# Patient Record
Sex: Male | Born: 1970 | Race: Black or African American | Hispanic: No | Smoking: Never smoker
Health system: Southern US, Community
[De-identification: ages and names within clinical notes are randomized; demographics above are authoritative.]

## PROBLEM LIST (undated history)

## (undated) DIAGNOSIS — J189 Pneumonia, unspecified organism: Secondary | ICD-10-CM

## (undated) HISTORY — PX: APPENDECTOMY: SHX54

## (undated) HISTORY — PX: SKIN GRAFT: SHX250

---

## 2007-10-31 ENCOUNTER — Emergency Department (HOSPITAL_COMMUNITY): Admission: EM | Admit: 2007-10-31 | Discharge: 2007-10-31 | Payer: Self-pay | Admitting: Emergency Medicine

## 2009-06-12 ENCOUNTER — Emergency Department (HOSPITAL_COMMUNITY): Admission: EM | Admit: 2009-06-12 | Discharge: 2009-06-12 | Payer: Self-pay | Admitting: Emergency Medicine

## 2009-11-20 ENCOUNTER — Emergency Department (HOSPITAL_COMMUNITY): Admission: EM | Admit: 2009-11-20 | Discharge: 2009-11-20 | Payer: Self-pay | Admitting: Emergency Medicine

## 2009-12-07 ENCOUNTER — Emergency Department (HOSPITAL_COMMUNITY): Admission: EM | Admit: 2009-12-07 | Discharge: 2009-12-08 | Payer: Self-pay | Admitting: Emergency Medicine

## 2010-12-20 IMAGING — CR DG CHEST 2V
2 series · 2 of 2 positions shown · non-contrast
Comparison: None.

CLINICAL DATA: 38-year-old male with flu symptoms, fever, shortness
of breath, cough, congestion, body aches.

CHEST - 2 VIEW

[w chest pa]
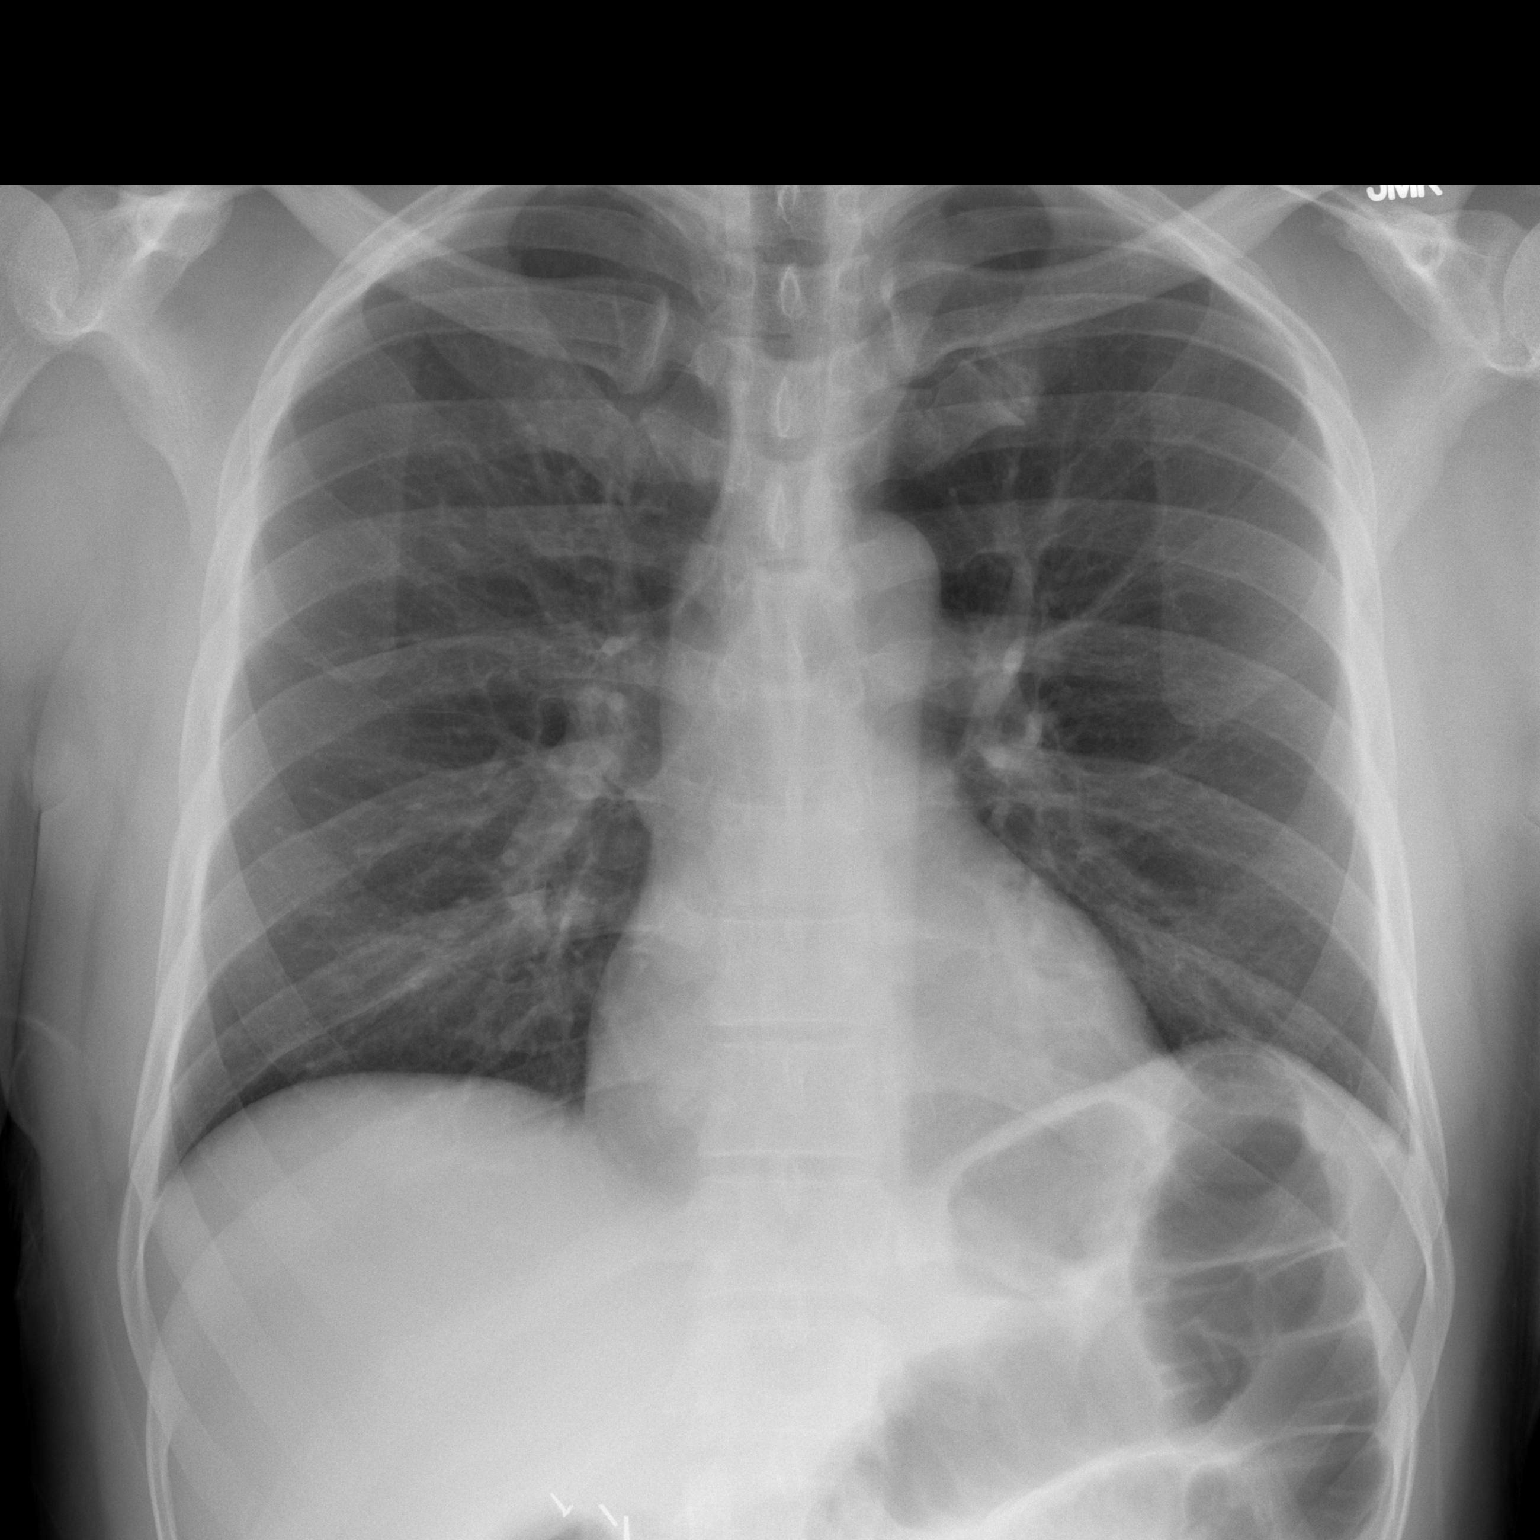

[w chest lat]
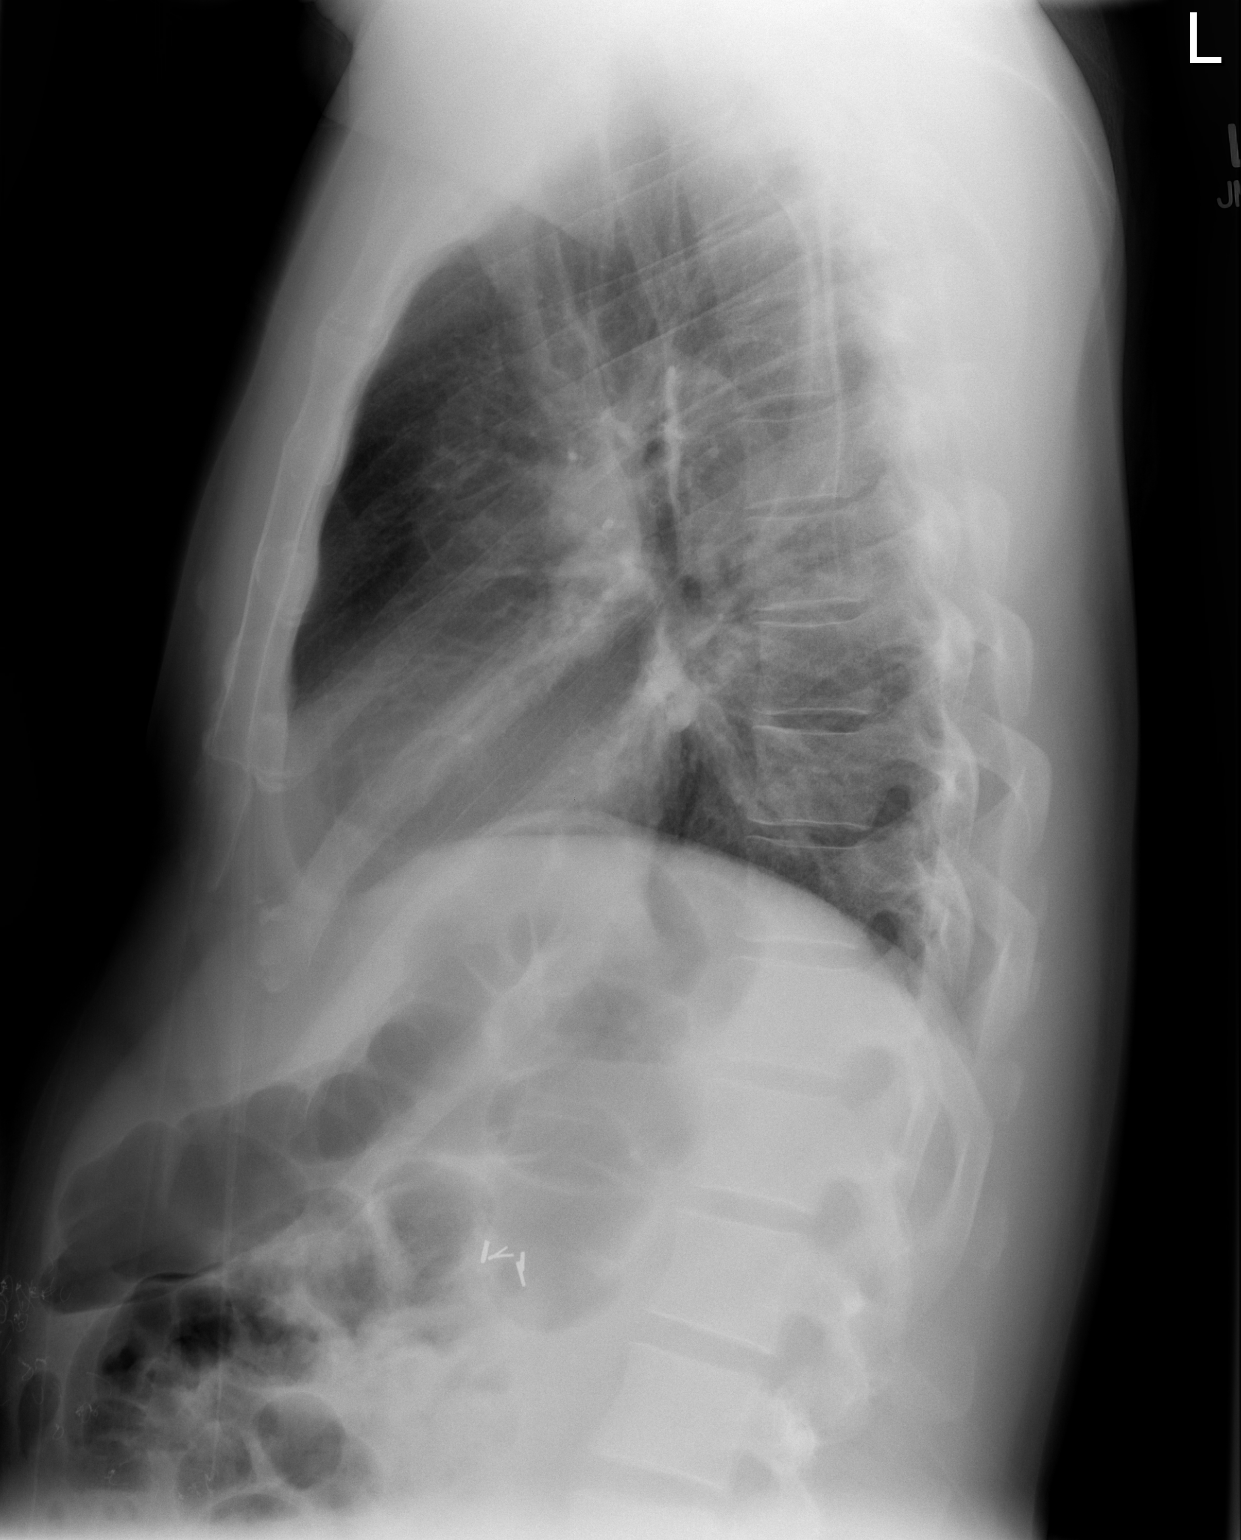

[2 of 2 positions shown; findings below may reference images not displayed]

FINDINGS: Mild elevation of the left hemidiaphragm.  No definite
associated airspace disease or consolidation.  No pleural effusion.
Cardiac size and mediastinal contours are within normal limits.
Visualized tracheal air column is within normal limits.  No acute
osseous abnormality identified.  Surgical clips in the right upper
quadrant.
IMPRESSION: Mild elevation of the left hemidiaphragm without definite
associated airspace disease/pneumonia.

## 2011-02-18 ENCOUNTER — Emergency Department (HOSPITAL_COMMUNITY)
Admission: EM | Admit: 2011-02-18 | Discharge: 2011-02-18 | Disposition: A | Discharge status: Home / Self Care | Payer: Self-pay | Attending: Emergency Medicine | Admitting: Emergency Medicine

## 2011-02-18 DIAGNOSIS — J309 Allergic rhinitis, unspecified: Secondary | ICD-10-CM | POA: Insufficient documentation

## 2011-02-23 LAB — DIFFERENTIAL
Basophils Relative: 0 % (ref 0–1)
Eosinophils Absolute: 0.1 10*3/uL (ref 0.0–0.7)
Lymphs Abs: 0.9 10*3/uL (ref 0.7–4.0)
Neutrophils Relative %: 89 % — ABNORMAL HIGH (ref 43–77)

## 2011-02-23 LAB — URINALYSIS, ROUTINE W REFLEX MICROSCOPIC
Nitrite: NEGATIVE
Specific Gravity, Urine: 1.026 (ref 1.005–1.030)
Urobilinogen, UA: 1 mg/dL (ref 0.0–1.0)

## 2011-02-23 LAB — POCT I-STAT, CHEM 8
Creatinine, Ser: 1.1 mg/dL (ref 0.4–1.5)
HCT: 48 % (ref 39.0–52.0)
Hemoglobin: 16.3 g/dL (ref 13.0–17.0)
Potassium: 3.5 mEq/L (ref 3.5–5.1)
Sodium: 138 mEq/L (ref 135–145)

## 2011-02-23 LAB — URINE MICROSCOPIC-ADD ON

## 2011-02-23 LAB — CBC
MCV: 91.1 fL (ref 78.0–100.0)
Platelets: 124 10*3/uL — ABNORMAL LOW (ref 150–400)
WBC: 13.8 10*3/uL — ABNORMAL HIGH (ref 4.0–10.5)

## 2011-03-21 ENCOUNTER — Emergency Department (HOSPITAL_COMMUNITY)
Admission: EM | Admit: 2011-03-21 | Discharge: 2011-03-21 | Disposition: A | Discharge status: Home / Self Care | Payer: Self-pay | Attending: Emergency Medicine | Admitting: Emergency Medicine

## 2011-03-21 ENCOUNTER — Emergency Department (HOSPITAL_COMMUNITY): Payer: Self-pay

## 2011-03-21 DIAGNOSIS — S63509A Unspecified sprain of unspecified wrist, initial encounter: Secondary | ICD-10-CM | POA: Insufficient documentation

## 2011-03-21 DIAGNOSIS — M25539 Pain in unspecified wrist: Secondary | ICD-10-CM | POA: Insufficient documentation

## 2011-03-21 DIAGNOSIS — X58XXXA Exposure to other specified factors, initial encounter: Secondary | ICD-10-CM | POA: Insufficient documentation

## 2011-03-21 DIAGNOSIS — Y9367 Activity, basketball: Secondary | ICD-10-CM | POA: Insufficient documentation

## 2011-08-31 LAB — DIFFERENTIAL
Basophils Absolute: 0
Basophils Relative: 0
Eosinophils Relative: 1
Lymphocytes Relative: 16
Neutro Abs: 7.8 — ABNORMAL HIGH

## 2011-08-31 LAB — CBC
HCT: 42.9
Platelets: 214
RDW: 12.4

## 2011-08-31 LAB — BASIC METABOLIC PANEL
BUN: 10
Creatinine, Ser: 0.96
GFR calc non Af Amer: >60

## 2012-03-25 ENCOUNTER — Emergency Department (HOSPITAL_COMMUNITY)
Admission: EM | Admit: 2012-03-25 | Discharge: 2012-03-25 | Disposition: A | Discharge status: Home / Self Care | Payer: Self-pay | Attending: Emergency Medicine | Admitting: Emergency Medicine

## 2012-03-25 ENCOUNTER — Encounter (HOSPITAL_COMMUNITY): Payer: Self-pay | Admitting: Emergency Medicine

## 2012-03-25 DIAGNOSIS — Z87828 Personal history of other (healed) physical injury and trauma: Secondary | ICD-10-CM | POA: Insufficient documentation

## 2012-03-25 DIAGNOSIS — L980 Pyogenic granuloma: Secondary | ICD-10-CM | POA: Insufficient documentation

## 2012-03-25 NOTE — ED Provider Notes (Signed)
History     CSN: [id number]  Arrival date & time 03/25/12  4098   First MD Initiated Contact with Patient 03/25/12 1005      Chief Complaint  Patient presents with  . Wound Check    (Consider location/radiation/quality/duration/timing/severity/associated sxs/prior treatment) HPI Comments: Patient reports 3-4 weeks ago he injured his forehead at work but does not remember the circumstances, states that he had a flat scab on his forehead.   States he noticed gradual tissue growth from the wound. Two weeks ago he was seen by his [facility] who put him on antibiotics, the next week they attempted to cauterize it.  Today he presents to the ED for continued nonhealing.  Denies fevers, significant discharge or bleeding.    The history is provided by the patient.    History reviewed. No pertinent past medical history.  History reviewed. No pertinent past surgical history.  History reviewed. No pertinent family history.  History  Substance Use Topics  . Smoking status: Never Smoker   . Smokeless tobacco: Not on file  . Alcohol Use: No      Review of Systems  Constitutional: Negative for fever, chills and fatigue.  Skin: Positive for wound.  Neurological: Negative for weakness and headaches.    Allergies  Review of patient's allergies indicates no known allergies.  Home Medications   Current Outpatient Rx  Name Route Sig Dispense Refill  . ADULT MULTIVITAMIN W/MINERALS CH Oral Take 1 tablet by mouth daily.    Marland Kitchen OVER THE COUNTER MEDICATION Oral Take 1 capsule by mouth 2 (two) times daily. Vitamin Flow Motion      BP 131/88  Pulse 76  Temp(Src) 97.8 F (36.6 C) (Oral)  Resp 18  SpO2 97%  Physical Exam  Nursing note and vitals reviewed. Constitutional: He is oriented to person, place, and time. He appears well-developed and well-nourished. No distress.  HENT:  Head: Macrocephalic.         No surrounding erythema, edema.  No warmth, no discharge or  bleeding.  Nontender to palpation.    Neck: Neck supple.  Pulmonary/Chest: Effort normal.  Neurological: He is alert and oriented to person, place, and time.  Skin: He is not diaphoretic.    ED Course  Procedures (including critical care time)  Labs Reviewed - No data to display No results found.  10:43 AM Patient discussed with and also seen by Dr [facility], who diagnosed pyogenic granuloma and suggests outpatient referral to ENT for excision.    I spoke with Dr [facility] who agrees to see patient in his clinic in [place].  I have discussed the diagnosis and follow up plan with patient who agrees and verbalizes understanding.    1. Pyogenic granuloma of skin       MDM  Patient with gradual onset of skin lesion following trauma (although unclear how the trauma occurred).  Has formed pyogenic granuloma.  Pt to f/u as outpatient with Dr [facility] (ENT) for further evaluation and treatment.  Patient verbalizes understanding and agrees with plan.          Thomas Holt, [place] 03/25/12 1233

## 2012-03-25 NOTE — Discharge Instructions (Signed)
Please call the doctor listed above to schedule an appointment to have your pyogenic granuloma removed.  His clinic is in [place].  Please tell his office staff that we spoke to him from the ER, and he is expecting to see you in clinic.   You may return to the ER at any time for worsening condition or any new symptoms that concern you

## 2012-03-25 NOTE — ED Notes (Signed)
Pt has small wound on forehead that he sts has not healed in 3 weeks

## 2012-03-25 NOTE — ED Notes (Signed)
Pt. Has a wound on his forehead that was a pimple and he squeezed it and now has this solid tissue coming from it and it is bleeding.  Pt. Went to his doctor, but he attempted to cauderize it, but pt. Reports that it was unsuccessful.  He is on antibiotics

## 2012-03-28 NOTE — ED Provider Notes (Signed)
Medical screening examination/treatment/procedure(s) were conducted as a shared visit with non-physician practitioner(s) and myself.  I personally evaluated the patient during the encounter Pt with a granulomatous lesion appx 1 cm on forehead, appears to be a pyogenic granuloma.  Referral to ENT for treatment of this lesion.   Carleene Cooper III, MD 03/28/12 1145

## 2012-09-27 ENCOUNTER — Emergency Department (HOSPITAL_COMMUNITY)
Admission: EM | Admit: 2012-09-27 | Discharge: 2012-09-27 | Disposition: A | Discharge status: Home / Self Care | Payer: Self-pay | Attending: Emergency Medicine | Admitting: Emergency Medicine

## 2012-09-27 DIAGNOSIS — J069 Acute upper respiratory infection, unspecified: Secondary | ICD-10-CM | POA: Insufficient documentation

## 2012-09-27 DIAGNOSIS — J3489 Other specified disorders of nose and nasal sinuses: Secondary | ICD-10-CM | POA: Insufficient documentation

## 2012-09-27 DIAGNOSIS — J029 Acute pharyngitis, unspecified: Secondary | ICD-10-CM | POA: Insufficient documentation

## 2012-09-27 DIAGNOSIS — B9789 Other viral agents as the cause of diseases classified elsewhere: Secondary | ICD-10-CM

## 2012-09-27 DIAGNOSIS — IMO0001 Reserved for inherently not codable concepts without codable children: Secondary | ICD-10-CM | POA: Insufficient documentation

## 2012-09-27 DIAGNOSIS — R509 Fever, unspecified: Secondary | ICD-10-CM | POA: Insufficient documentation

## 2012-09-27 MED ORDER — GUAIFENESIN 100 MG/5ML PO LIQD: 100.0000 mg | ORAL | Status: DC | PRN

## 2012-09-27 MED ORDER — IBUPROFEN 800 MG PO TABS: 800.0000 mg | ORAL_TABLET | Freq: Three times a day (TID) | ORAL | Status: DC | PRN

## 2012-09-27 NOTE — ED Provider Notes (Signed)
Medical screening examination/treatment/procedure(s) were performed by non-physician practitioner and as supervising physician I was immediately available for consultation/collaboration.  Jaston Havens, MD 09/27/12 2312 

## 2012-09-27 NOTE — ED Notes (Signed)
Pt states he began having flu like sx yesterday.  Left school today because he felt bad.  C/o cough, sore throat, runny nose, fatigue, and body aches.

## 2012-09-27 NOTE — ED Provider Notes (Signed)
History     CSN: [id number]  Arrival date & time 09/27/12  1657   First MD Initiated Contact with Patient 09/27/12 1829      Chief Complaint  Patient presents with  . Cough  . Sore Throat  . Nasal Congestion    (Consider location/radiation/quality/duration/timing/severity/associated sxs/prior treatment) HPI Comments: Patient reports he has had sore throat, nasal congestion, dry cough, body aches, subjective fever since yesterday.  Denies SOB, difficulty swallowing.  Pt has had sick contacts with similar symptoms.  Has taken nothing for his symptoms.  Came to ED today to "make sure I was okay."    Patient is a 41 y.o. male presenting with cough and pharyngitis. The history is provided by the patient.  Cough Associated symptoms include rhinorrhea, sore throat and myalgias. Pertinent negatives include no chest pain, no ear pain and no shortness of breath.  Sore Throat Associated symptoms include congestion, coughing, a fever, myalgias and a sore throat. Pertinent negatives include no abdominal pain, chest pain, nausea or vomiting.    No past medical history on file.  No past surgical history on file.  No family history on file.  History  Substance Use Topics  . Smoking status: Never Smoker   . Smokeless tobacco: Not on file  . Alcohol Use: No      Review of Systems  Constitutional: Positive for fever.  HENT: Positive for congestion, sore throat and rhinorrhea. Negative for ear pain and trouble swallowing.   Respiratory: Positive for cough. Negative for shortness of breath.   Cardiovascular: Negative for chest pain.  Gastrointestinal: Negative for nausea, vomiting, abdominal pain and diarrhea.  Musculoskeletal: Positive for myalgias.    Allergies  Review of patient's allergies indicates no known allergies.  Home Medications   Current Outpatient Rx  Name  Route  Sig  Dispense  Refill  . ADULT MULTIVITAMIN W/MINERALS CH   Oral   Take 1 tablet by mouth daily.         Marland Kitchen OVER THE COUNTER MEDICATION   Oral   Take 1 capsule by mouth 2 (two) times daily. Vitamin Flow Motion           BP 135/87  Pulse 99  Temp 98.4 F (36.9 C) (Oral)  Resp 18  SpO2 98%  Physical Exam  Nursing note and vitals reviewed. Constitutional: He appears well-developed and well-nourished. No distress.  HENT:  Head: Normocephalic and atraumatic.  Nose: Mucosal edema and rhinorrhea present. Right sinus exhibits no maxillary sinus tenderness and no frontal sinus tenderness. Left sinus exhibits no maxillary sinus tenderness and no frontal sinus tenderness.  Mouth/Throat: Oropharynx is clear and moist. No oropharyngeal exudate.  Neck: Trachea normal, normal range of motion and phonation normal. Neck supple. No tracheal tenderness present. No tracheal deviation present.  Cardiovascular: Normal rate and regular rhythm.   Pulmonary/Chest: Effort normal and breath sounds normal. No stridor. No respiratory distress. He has no wheezes. He has no rales.  Lymphadenopathy:    He has no cervical adenopathy.  Neurological: He is alert.  Skin: He is not diaphoretic.    ED Course  Procedures (including critical care time)  Labs Reviewed - No data to display No results found.   1. Viral respiratory infection     MDM  Pt with two days of respiratory symptoms, recent sick contacts.  Lungs CTAB, doubt pneumonia.  Oropharynx clear without edema, exudates, no cervical lymphadenopathy, cough is present, pt is afebrile - doubt strep.  Like viral infection.  Pt  d/c home with symptomatic treatment.  Discussed diagnosis and plan with patient.  Pt verbalizes understanding and agrees with plan.   Pt given return precautions.          [place], [place] 09/27/12 [phone]

## 2013-05-17 ENCOUNTER — Emergency Department (HOSPITAL_COMMUNITY): Payer: Self-pay

## 2013-05-17 ENCOUNTER — Encounter (HOSPITAL_COMMUNITY): Payer: Self-pay

## 2013-05-17 ENCOUNTER — Emergency Department (HOSPITAL_COMMUNITY)
Admission: EM | Admit: 2013-05-17 | Discharge: 2013-05-17 | Disposition: A | Discharge status: Home / Self Care | Payer: Self-pay | Attending: Emergency Medicine | Admitting: Emergency Medicine

## 2013-05-17 DIAGNOSIS — R0789 Other chest pain: Secondary | ICD-10-CM | POA: Insufficient documentation

## 2013-05-17 LAB — POCT I-STAT TROPONIN I: Troponin i, poc: 0 ng/mL (ref 0.00–0.08)

## 2013-05-17 LAB — BASIC METABOLIC PANEL
BUN: 13 mg/dL (ref 6–23)
Chloride: 105 mEq/L (ref 96–112)
GFR calc Af Amer: 86 mL/min — ABNORMAL LOW (ref >90)
Potassium: 3.4 mEq/L — ABNORMAL LOW (ref 3.5–5.1)
Sodium: 142 mEq/L (ref 135–145)

## 2013-05-17 LAB — CBC
HCT: 44.4 % (ref 39.0–52.0)
RDW: 12.5 % (ref 11.5–15.5)
WBC: 9.7 10*3/uL (ref 4.0–10.5)

## 2013-05-17 MED ORDER — ASPIRIN 81 MG PO CHEW: 324.0000 mg | CHEWABLE_TABLET | Freq: Once | ORAL | Status: DC

## 2013-05-17 NOTE — ED Notes (Signed)
Patient ambulated without difficulty. O2 sats remained at 95% while ambulating.

## 2013-05-17 NOTE — ED Provider Notes (Signed)
History    CSN: [id number] Arrival date & time 05/17/13  1221  First MD Initiated Contact with Patient 05/17/13 1249     Chief Complaint  Patient presents with  . Chest Pain   (Consider location/radiation/quality/duration/timing/severity/associated sxs/prior Treatment) HPI 42 yo male presents to the ER via EMS from local basketball gym with complaint of chest pain.  Pt reports during the game he had sudden onset of right sided chest pain.  He reports the pain was sharp, shooting, severe.  He was unable to stand up with the pain.  After resting a few minutes, the pain eased.  It has since resolved.  No prior h/o same.  He denies diaphoresis or nausea with the pain.  No radiation of the pain.  He was slightly sob with the pain.  No current symptoms.  Pt denies pmh.  Family history of htn, dm, cad in father in 48's.    History reviewed. No pertinent past medical history. History reviewed. No pertinent past surgical history. History reviewed. No pertinent family history. History  Substance Use Topics  . Smoking status: Never Smoker   . Smokeless tobacco: Not on file  . Alcohol Use: No    Review of Systems  All other systems reviewed and are negative.    Allergies  Review of patient's allergies indicates no known allergies.  Home Medications  No current outpatient prescriptions on file. BP 128/69  Pulse 102  Temp(Src) 98 F (36.7 C) (Oral)  Resp 20  SpO2 95% Physical Exam  Nursing note and vitals reviewed. Constitutional: He is oriented to person, place, and time. He appears well-developed and well-nourished.  HENT:  Head: Normocephalic and atraumatic.  Right Ear: External ear normal.  Left Ear: External ear normal.  Nose: Nose normal.  Mouth/Throat: Oropharynx is clear and moist.  Eyes: Conjunctivae and EOM are normal. Pupils are equal, round, and reactive to light.  Neck: Normal range of motion. Neck supple. No JVD present. No tracheal deviation present. No  thyromegaly present.  Cardiovascular: Normal rate, regular rhythm, normal heart sounds and intact distal pulses.  Exam reveals no gallop and no friction rub.   No murmur heard. Pulmonary/Chest: Effort normal and breath sounds normal. No stridor. No respiratory distress. He has no wheezes. He has no rales. He exhibits no tenderness.  Abdominal: Soft. Bowel sounds are normal. He exhibits no distension and no mass. There is no tenderness. There is no rebound and no guarding.  Musculoskeletal: Normal range of motion. He exhibits no edema and no tenderness.  Lymphadenopathy:    He has no cervical adenopathy.  Neurological: He is oriented to person, place, and time. He has normal reflexes. No cranial nerve deficit. He exhibits normal muscle tone. Coordination normal.  Skin: Skin is dry. No rash noted. No erythema. No pallor.  Psychiatric: He has a normal mood and affect. His behavior is normal. Judgment and thought content normal.    ED Course  Procedures (including critical care time) Labs Reviewed  BASIC METABOLIC PANEL - Abnormal; Notable for the following:    Potassium 3.4 (*)    Glucose, Bld 105 (*)    GFR calc non Af Amer 75 (*)    GFR calc Af Amer 86 (*)    All other components within normal limits  CBC  POCT I-STAT TROPONIN I    Date: 05/17/2013  Rate: 104  Rhythm: sinus tachycardia  QRS Axis: normal  Intervals: normal  ST/T Wave abnormalities: nonspecific T wave changes  Conduction Disutrbances:none  Narrative Interpretation:   Old EKG Reviewed: none available   Dg Chest 2 View  05/17/2013   *RADIOLOGY REPORT*  Clinical Data: Chest pain after exercise.  CHEST - 2 VIEW  Comparison: 11/20/2009.  Findings: There are lower lung volumes with new bibasilar atelectasis.  No consolidation, edema or pleural effusion is present.  Heart size and mediastinal contours are stable.  The osseous structures appear normal.  Cholecystectomy clips are noted.  IMPRESSION: Mild bibasilar  atelectasis.  No other acute findings.   Original Report Authenticated By: Carey Bullocks, M.D.   1. Chest pain, atypical     MDM  42 yo male with sharp right sided chest pain during basketball game today.  Sxs have since resolved.  Will check cxr, labs.  Seems very atypical for ACS.  PTX in differential, but now asymptomatic.    Olivia Mackie, MD 05/18/13 [phone]

## 2013-05-17 NOTE — ED Notes (Signed)
Pt complains of sharp right sided chest pain while playing basketball, subside with rest.

## 2014-01-25 ENCOUNTER — Encounter (HOSPITAL_COMMUNITY): Payer: Self-pay | Admitting: Emergency Medicine

## 2014-01-25 ENCOUNTER — Emergency Department (HOSPITAL_COMMUNITY)
Admission: EM | Admit: 2014-01-25 | Discharge: 2014-01-25 | Disposition: A | Discharge status: Home / Self Care | Payer: No Typology Code available for payment source | Source: Home / Self Care | Attending: Emergency Medicine | Admitting: Emergency Medicine

## 2014-01-25 DIAGNOSIS — M545 Low back pain, unspecified: Secondary | ICD-10-CM

## 2014-01-25 MED ORDER — IBUPROFEN 800 MG PO TABS
ORAL_TABLET | ORAL | Status: AC
  Filled 2014-01-25: qty 1

## 2014-01-25 MED ORDER — IBUPROFEN 600 MG PO TABS: 600.0000 mg | ORAL_TABLET | Freq: Three times a day (TID) | ORAL | Status: DC | PRN

## 2014-01-25 MED ORDER — IBUPROFEN 800 MG PO TABS
800.0000 mg | ORAL_TABLET | Freq: Once | ORAL | Status: AC
  Administered 2014-01-25: 800 mg via ORAL

## 2014-01-25 NOTE — Discharge Instructions (Signed)
Back Injury Prevention Back injuries can be extremely painful and difficult to heal. After having one back injury, you are much more likely to experience another later on. It is important to learn how to avoid injuring or re-injuring your back. The following tips can help you to prevent a back injury. PHYSICAL FITNESS  Exercise regularly and try to develop good tone in your abdominal muscles. Your abdominal muscles provide a lot of the support needed by your back.  Do aerobic exercises (walking, jogging, biking, swimming) regularly.  Do exercises that increase balance and strength (tai chi, yoga) regularly. This can decrease your risk of falling and injuring your back.  Stretch before and after exercising.  Maintain a healthy weight. The more you weigh, the more stress is placed on your back. For every pound of weight, 10 times that amount of pressure is placed on the back. DIET  Talk to your caregiver about how much calcium and vitamin D you need per day. These nutrients help to prevent weakening of the bones (osteoporosis). Osteoporosis can cause broken (fractured) bones that lead to back pain.  Include good sources of calcium in your diet, such as dairy products, green, leafy vegetables, and products with calcium added (fortified).  Include good sources of vitamin D in your diet, such as milk and foods that are fortified with vitamin D.  Consider taking a nutritional supplement or a multivitamin if needed.  Stop smoking if you smoke. POSTURE  Sit and stand up straight. Avoid leaning forward when you sit or hunching over when you stand.  Choose chairs with good low back (lumbar) support.  If you work at a desk, sit close to your work so you do not need to lean over. Keep your chin tucked in. Keep your neck drawn back and elbows bent at a right angle. Your arms should look like the letter "L."  Sit high and close to the steering wheel when you drive. Add a lumbar support to your car  seat if needed.  Avoid sitting or standing in one position for too long. Take breaks to get up, stretch, and walk around at least once every hour. Take breaks if you are driving for long periods of time.  Sleep on your side with your knees slightly bent, or sleep on your back with a pillow under your knees. Do not sleep on your stomach. LIFTING, TWISTING, AND REACHING  Avoid heavy lifting, especially repetitive lifting. If you must do heavy lifting:  Stretch before lifting.  Work slowly.  Rest between lifts.  Use carts and dollies to move objects when possible.  Make several small trips instead of carrying 1 heavy load.  Ask for help when you need it.  Ask for help when moving big, awkward objects.  Follow these steps when lifting:  Stand with your feet shoulder-width apart.  Get as close to the object as you can. Do not try to pick up heavy objects that are far from your body.  Use handles or lifting straps if they are available.  Bend at your knees. Squat down, but keep your heels off the floor.  Keep your shoulders pulled back, your chin tucked in, and your back straight.  Lift the object slowly, tightening the muscles in your legs, abdomen, and buttocks. Keep the object as close to the center of your body as possible.  When you put a load down, use these same guidelines in reverse.  Do not:  Lift the object above your waist.  Twist at the waist while lifting or carrying a load. Move your feet if you need to turn, not your waist. °· Bend over without bending at your knees. °· Avoid reaching over your head, across a table, or for an object on a high surface. °OTHER TIPS °· Avoid wet floors and keep sidewalks clear of ice to prevent falls. °· Do not sleep on a mattress that is too soft or too hard. °· Keep items that are used frequently within easy reach. °· Put heavier objects on shelves at waist level and lighter objects on lower or higher shelves. °· Find ways to  decrease your stress, such as exercise, massage, or relaxation techniques. Stress can build up in your muscles. Tense muscles are more vulnerable to injury. °· Seek treatment for depression or anxiety if needed. These conditions can increase your risk of developing back pain. °SEEK MEDICAL CARE IF: °· You injure your back. °· You have questions about diet, exercise, or other ways to prevent back injuries. °MAKE SURE YOU: °· Understand these instructions. °· Will watch your condition. °· Will get help right away if you are not doing well or get worse. °Document Released: 12/17/2004 Document Revised: 02/01/2012 Document Reviewed: 12/21/2011 °[employer]® Patient Information ©2014 [employer]. ° °Back Exercises °Back exercises help treat and prevent back injuries. The goal of back exercises is to increase the strength of your abdominal and back muscles and the flexibility of your back. These exercises should be started when you no longer have back pain. Back exercises include: °· Pelvic Tilt. Lie on your back with your knees bent. Tilt your pelvis until the lower part of your back is against the floor. Hold this position 5 to 10 sec and repeat 5 to 10 times. °· Knee to Chest. Pull first 1 knee up against your chest and hold for 20 to 30 seconds, repeat this with the other knee, and then both knees. This may be done with the other leg straight or bent, whichever feels better. °· Sit-Ups or Curl-Ups. Bend your knees 90 degrees. Start with tilting your pelvis, and do a partial, slow sit-up, lifting your trunk only 30 to 45 degrees off the floor. Take at least 2 to 3 seconds for each sit-up. Do not do sit-ups with your knees out straight. If partial sit-ups are difficult, simply do the above but with only tightening your abdominal muscles and holding it as directed. °· Hip-Lift. Lie on your back with your knees flexed 90 degrees. Push down with your feet and shoulders as you raise your hips a couple inches off the floor;  hold for 10 seconds, repeat 5 to 10 times. °· Back arches. Lie on your stomach, propping yourself up on bent elbows. Slowly press on your hands, causing an arch in your low back. Repeat 3 to 5 times. Any initial stiffness and discomfort should lessen with repetition over time. °· Shoulder-Lifts. Lie face down with arms beside your body. Keep hips and torso pressed to floor as you slowly lift your head and shoulders off the floor. °Do not overdo your exercises, especially in the beginning. Exercises may cause you some mild back discomfort which lasts for a few minutes; however, if the pain is more severe, or lasts for more than 15 minutes, do not continue exercises until you see your caregiver. Improvement with exercise therapy for back problems is slow.  °See your caregivers for assistance with developing a proper back exercise program. °Document Released: 12/17/2004 Document Revised: 02/01/2012 Document Reviewed:   09/10/2011 [employer] Patient Information 2014 [employer].

## 2014-01-25 NOTE — ED Provider Notes (Signed)
Medical screening examination/treatment/procedure(s) were performed by non-physician practitioner and as supervising physician I was immediately available for consultation/collaboration.  Myleka Moncure, M.D.  Mya Suell C Marton Malizia, MD 01/25/14 2213 

## 2014-01-25 NOTE — ED Notes (Signed)
C/o lower back pain after playing basketball yesterday.  Denies any known injury and urinary symptoms.  No otc meds taken

## 2014-01-25 NOTE — ED Provider Notes (Signed)
CSN: [id number]     Arrival date & time 01/25/14  1854 History   First MD Initiated Contact with Patient 01/25/14 2007     Chief Complaint  Patient presents with  . Back Pain   (Consider location/radiation/quality/duration/timing/severity/associated sxs/prior Treatment) Patient is a 43 y.o. male presenting with back pain. The history is provided by the patient.  Back Pain Pain location: +lumbar region. Quality:  Aching and stiffness Radiates to:  Does not radiate Pain severity:  Mild Duration:  1 day Progression:  Waxing and waning Chronicity:  Recurrent Context comment:  Began a few hours after playing a basketball game Relieved by:  None tried Worsened by:  Nothing tried Ineffective treatments:  None tried Associated symptoms: no abdominal pain, no abdominal swelling, no bladder incontinence, no bowel incontinence, no chest pain, no dysuria, no fever, no headaches, no leg pain, no numbness, no paresthesias, no pelvic pain, no perianal numbness, no tingling, no weakness and no weight loss     History reviewed. No pertinent past medical history. History reviewed. No pertinent past surgical history. History reviewed. No pertinent family history. History  Substance Use Topics  . Smoking status: Never Smoker   . Smokeless tobacco: Not on file  . Alcohol Use: No    Review of Systems  Constitutional: Negative for fever and weight loss.  Cardiovascular: Negative for chest pain.  Gastrointestinal: Negative for abdominal pain and bowel incontinence.  Genitourinary: Negative for bladder incontinence, dysuria and pelvic pain.  Musculoskeletal: Positive for back pain.  Neurological: Negative for tingling, weakness, numbness, headaches and paresthesias.  All other systems reviewed and are negative.    Allergies  Review of patient's allergies indicates no known allergies.  Home Medications   Current Outpatient Rx  Name  Route  Sig  Dispense  Refill  . ibuprofen (ADVIL,MOTRIN)  600 MG tablet   Oral   Take 1 tablet (600 mg total) by mouth every 8 (eight) hours as needed for mild pain or moderate pain.   30 tablet   0    BP 129/84  Pulse 93  Temp(Src) 98.7 F (37.1 C) (Oral)  Resp 20  SpO2 99% Physical Exam  Nursing note and vitals reviewed. Constitutional: He is oriented to person, place, and time. He appears well-developed and well-nourished. No distress.  HENT:  Head: Normocephalic and atraumatic.  Eyes: Conjunctivae are normal. No scleral icterus.  Neck: Normal range of motion. Neck supple.  Cardiovascular: Normal rate, regular rhythm and normal heart sounds.   Pulmonary/Chest: Effort normal and breath sounds normal.  Abdominal: Soft. Bowel sounds are normal. He exhibits no distension. There is no tenderness.  Musculoskeletal: Normal range of motion.       Lumbar back: He exhibits normal range of motion, no tenderness, no bony tenderness, no swelling, no edema, no deformity, no laceration, no pain, no spasm and normal pulse.       Back:  Neurological: He is alert and oriented to person, place, and time. He has normal strength. No sensory deficit. Coordination and gait normal.  Reflex Scores:      Patellar reflexes are 2+ on the right side and 2+ on the left side.   ED Course  Procedures (including critical care time) Labs Review Labs Reviewed - No data to display Imaging Review No results found.   MDM   1. Lumbago    Mild lumbago after sports participation. Exam without worrisome finding. NSAIDs at home and follow up prn.    Ardis RowanJennifer Lee Reda Gettis, PA 01/25/14  2025 

## 2014-06-16 IMAGING — CR DG CHEST 2V
2 series · 2 of 2 positions shown · non-contrast
Comparison: 11/20/2009.

CLINICAL DATA: Chest pain after exercise.

CHEST - 2 VIEW

[w chest pa]
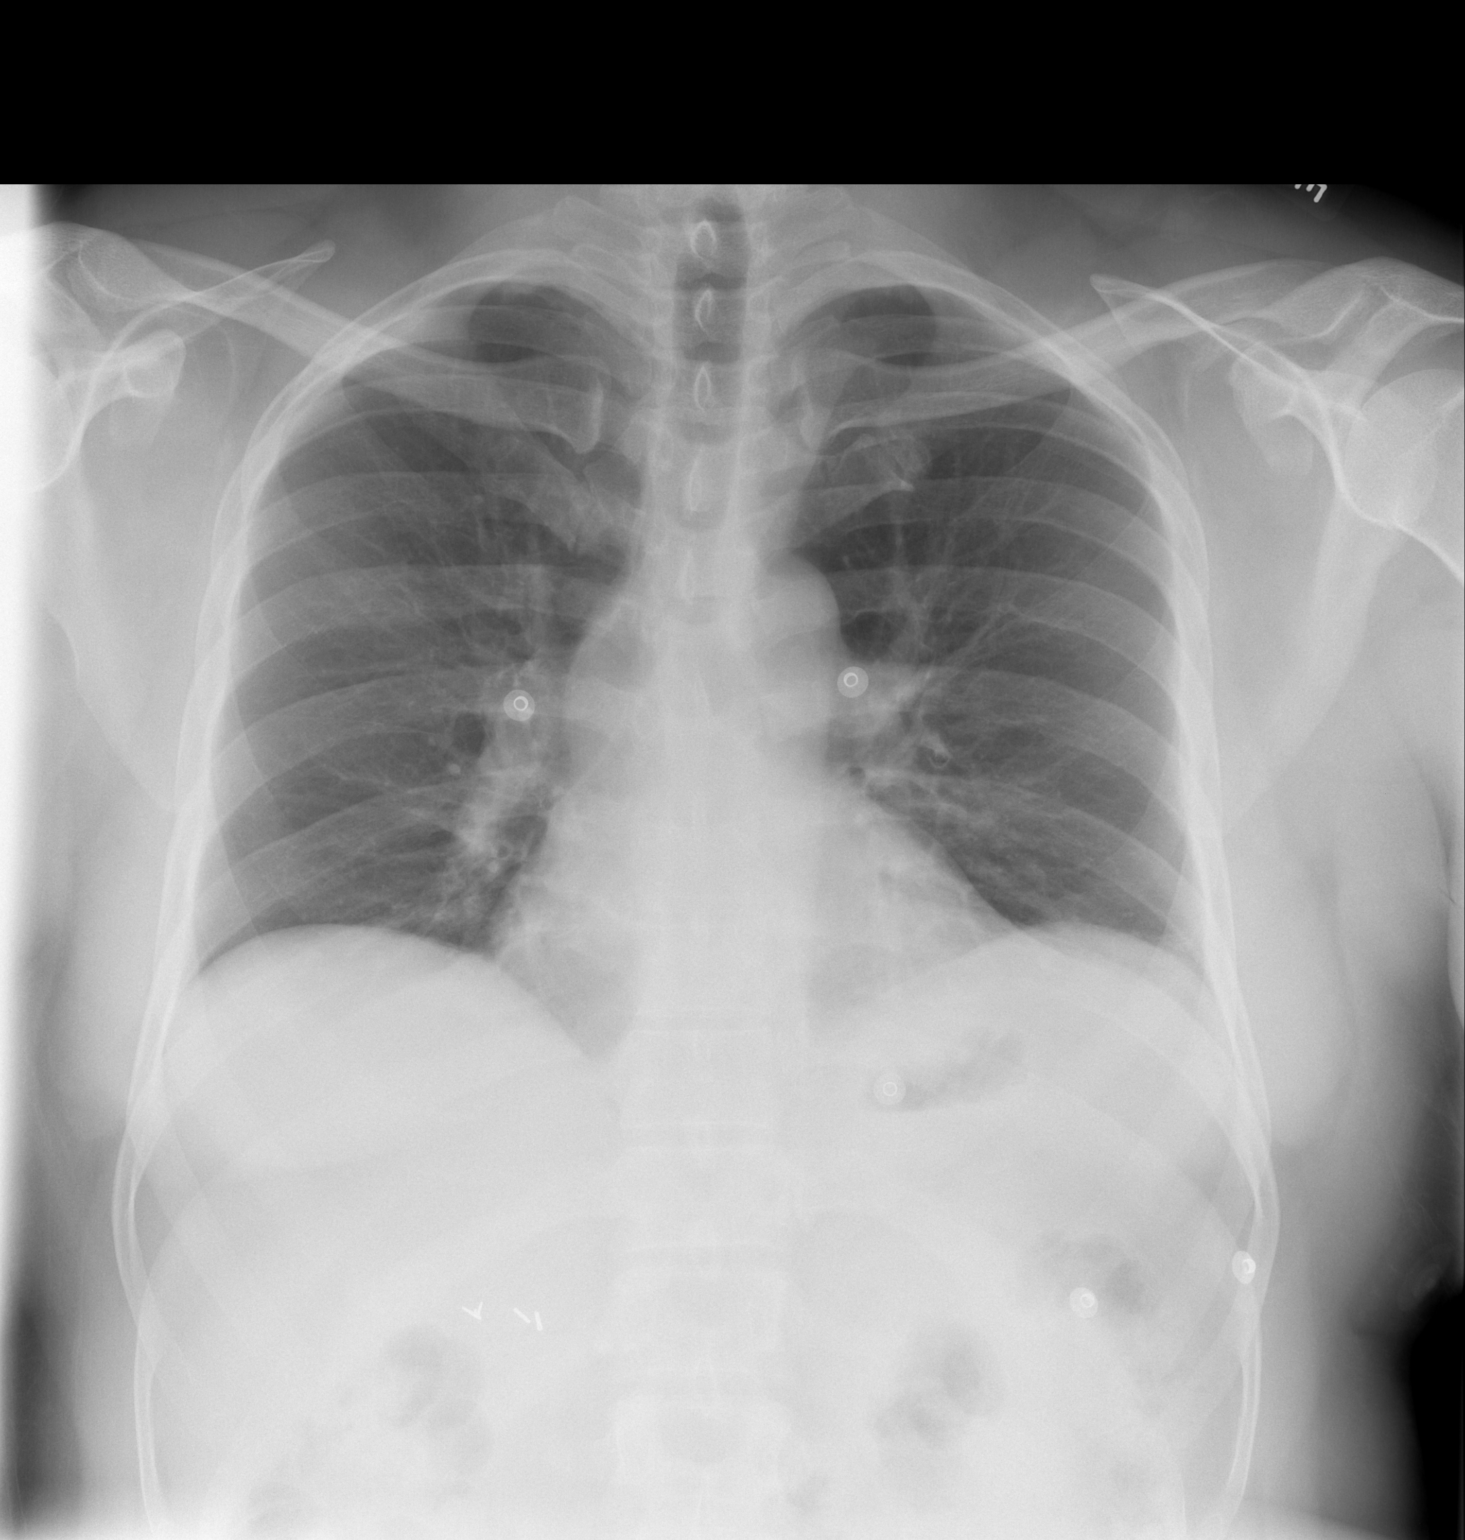

[w chest lat]
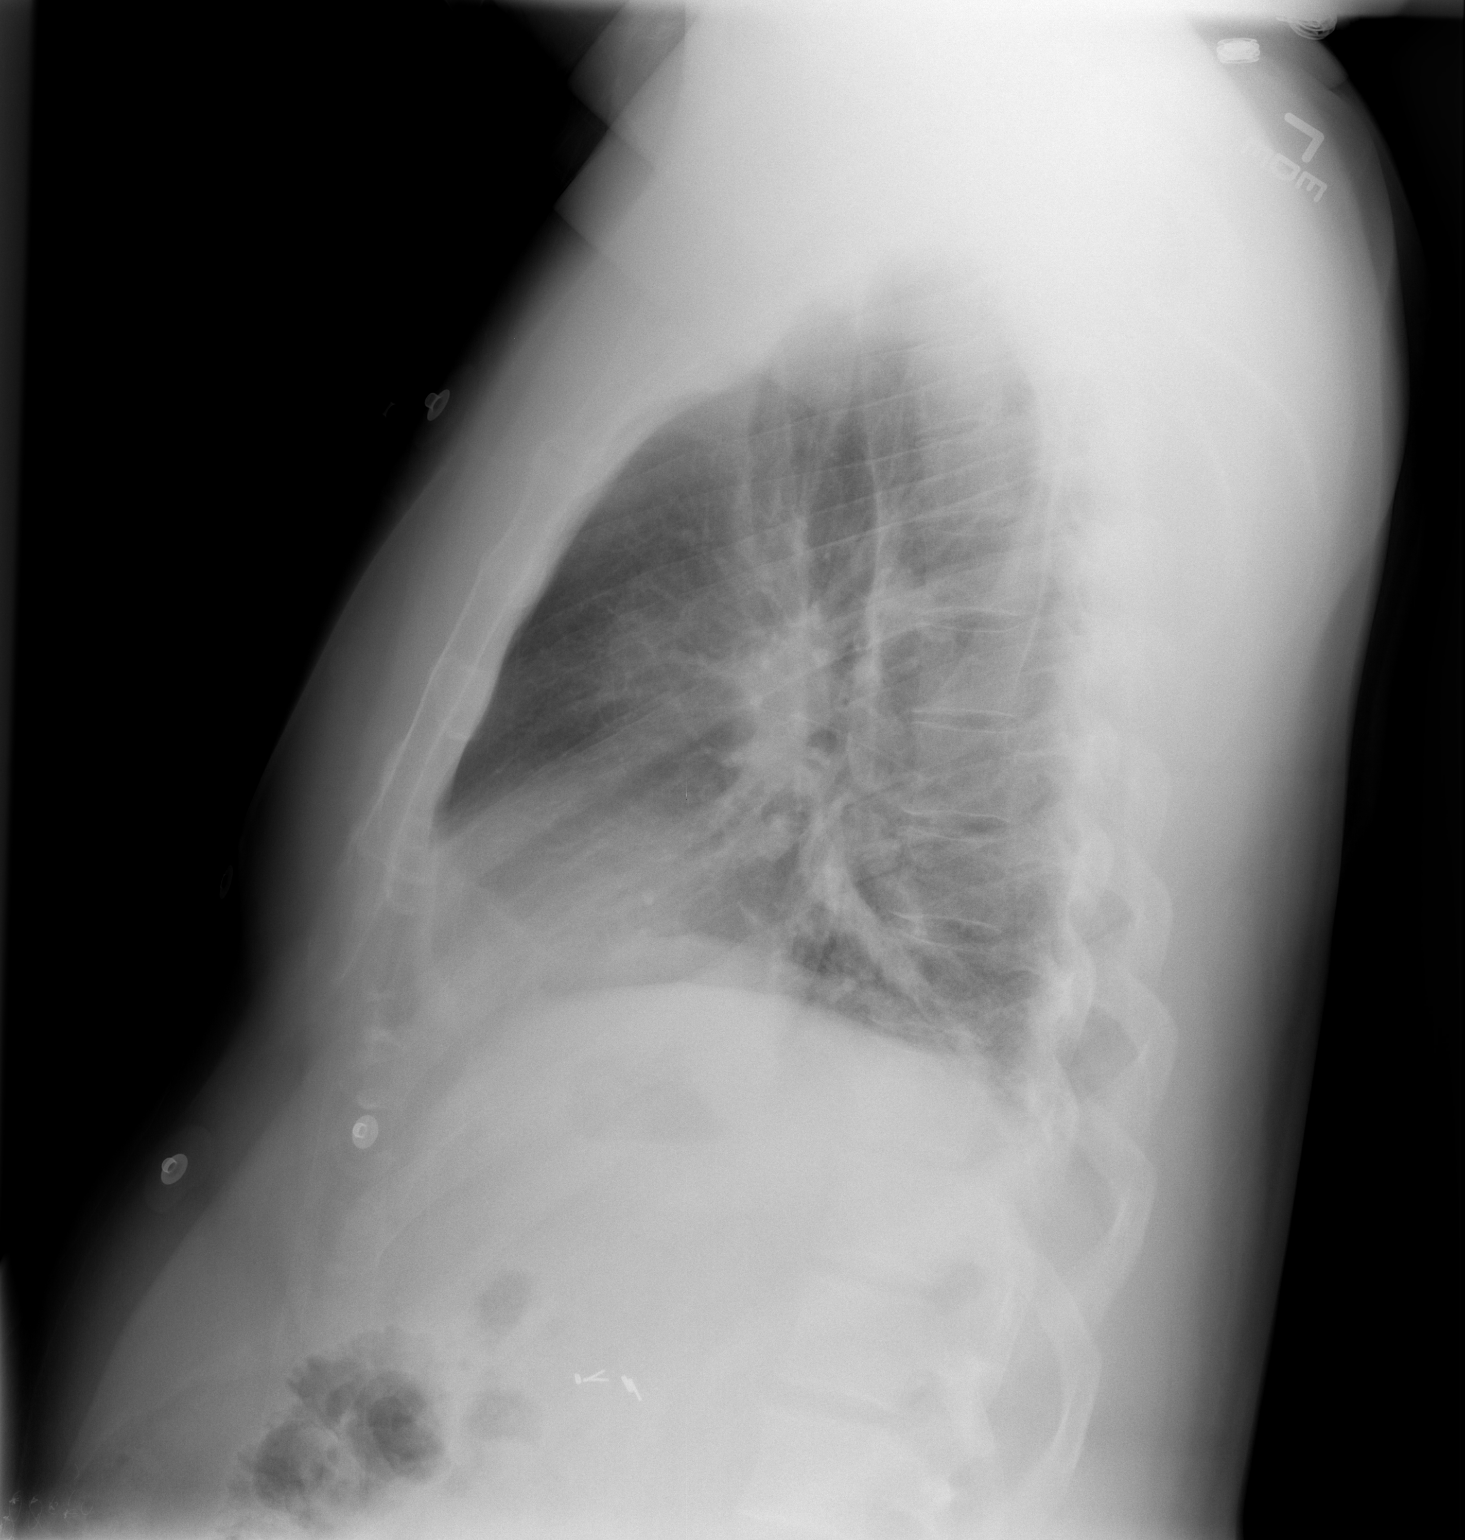

[2 of 2 positions shown; findings below may reference images not displayed]

FINDINGS: There are lower lung volumes with new bibasilar
atelectasis.  No consolidation, edema or pleural effusion is
present.  Heart size and mediastinal contours are stable.  The
osseous structures appear normal.  Cholecystectomy clips are noted.
IMPRESSION: Mild bibasilar atelectasis.  No other acute findings.

## 2014-09-14 ENCOUNTER — Emergency Department (HOSPITAL_COMMUNITY)
Admission: EM | Admit: 2014-09-14 | Discharge: 2014-09-14 | Disposition: A | Discharge status: Home / Self Care | Payer: No Typology Code available for payment source | Attending: Emergency Medicine | Admitting: Emergency Medicine

## 2014-09-14 ENCOUNTER — Encounter (HOSPITAL_COMMUNITY): Payer: Self-pay | Admitting: Emergency Medicine

## 2014-09-14 ENCOUNTER — Emergency Department (HOSPITAL_COMMUNITY): Payer: No Typology Code available for payment source

## 2014-09-14 DIAGNOSIS — M25532 Pain in left wrist: Secondary | ICD-10-CM | POA: Diagnosis not present

## 2014-09-14 DIAGNOSIS — Z8781 Personal history of (healed) traumatic fracture: Secondary | ICD-10-CM | POA: Insufficient documentation

## 2014-09-14 MED ORDER — MELOXICAM 7.5 MG PO TABS: 7.5000 mg | ORAL_TABLET | Freq: Every day | ORAL | Status: DC

## 2014-09-14 NOTE — Discharge Instructions (Signed)
Read the information below.  Use the prescribed medication as directed.  Please discuss all new medications with your pharmacist.  You may return to the Emergency Department at any time for worsening condition or any new symptoms that concern you.  If you develop uncontrolled pain, weakness or numbness of the extremity, severe discoloration of the skin, or you are unable to move your wrist or fingers, return to the ER for a recheck.      Wrist Pain Wrist injuries are frequent in adults and children. A sprain is an injury to the ligaments that hold your bones together. A strain is an injury to muscle or muscle cord-like structures (tendons) from stretching or pulling. Generally, when wrists are moderately tender to touch following a fall or injury, a break in the bone (fracture) may be present. Most wrist sprains or strains are better in 3 to 5 days, but complete healing may take several weeks. HOME CARE INSTRUCTIONS   Put ice on the injured area.  Put ice in a plastic bag.  Place a towel between your skin and the bag.  Leave the ice on for 15-20 minutes, 3-4 times a day, for the first 2 days, or as directed by your health care provider.  Keep your arm raised above the level of your heart whenever possible to reduce swelling and pain.  Rest the injured area for at least 48 hours or as directed by your health care provider.  If a splint or elastic bandage has been applied, use it for as long as directed by your health care provider or until seen by a health care provider for a follow-up exam.  Only take over-the-counter or prescription medicines for pain, discomfort, or fever as directed by your health care provider.  Keep all follow-up appointments. You may need to follow up with a specialist or have follow-up X-rays. Improvement in pain level is not a guarantee that you did not fracture a bone in your wrist. The only way to determine whether or not you have a broken bone is by X-ray. SEEK  IMMEDIATE MEDICAL CARE IF:   Your fingers are swollen, very red, white, or cold and blue.  Your fingers are numb or tingling.  You have increasing pain.  You have difficulty moving your fingers. MAKE SURE YOU:   Understand these instructions.  Will watch your condition.  Will get help right away if you are not doing well or get worse. Document Released: 08/19/2005 Document Revised: 11/14/2013 Document Reviewed: 12/31/2010 [facility] Patient Information 2015 [place], [place]. This information is not intended to replace advice given to you by your health care provider. Make sure you discuss any questions you have with your health care provider.    Emergency Department Resource Guide 1) Find a Doctor and Pay Out of Pocket Although you won't have to find out who is covered by your insurance plan, it is a good idea to ask around and get recommendations. You will then need to call the office and see if the doctor you have chosen will accept you as a new patient and what types of options they offer for patients who are self-pay. Some doctors offer discounts or will set up payment plans for their patients who do not have insurance, but you will need to ask so you aren't surprised when you get to your appointment.  2) Contact Your Local Health Department Not all health departments have doctors that can see patients for sick visits, but many do, so it is  worth a call to see if yours does. If you don't know where your local health department is, you can check in your phone book. The CDC also has a tool to help you locate your state's health department, and many state websites also have listings of all of their local health departments.  3) Find a Walk-in Clinic If your illness is not likely to be very severe or complicated, you may want to try a walk in clinic. These are popping up all over the country in pharmacies, drugstores, and shopping centers. They're usually staffed by nurse practitioners or  physician assistants that have been trained to treat common illnesses and complaints. They're usually fairly quick and inexpensive. However, if you have serious medical issues or chronic medical problems, these are probably not your best option.  No Primary Care Doctor: - Call Health Connect at  [phone] - they can help you locate a primary care doctor that  accepts your insurance, provides certain services, etc. - Physician Referral Service- [phone]  Chronic Pain Problems: Organization         Address  Phone   Notes  [facility]  [phone]) [phone] Patients need to be referred by their primary care doctor.   Medication Assistance: Organization         Address  Phone   Notes  [facility] Medication Kadrian Partch [facility] [address] [place], [place] [address] [phone]) [phone] --Must be a resident of [facility] [place] -- Must have NO insurance coverage whatsoever (no Medicaid/ Medicare, etc.) -- The pt. MUST have a primary care doctor that directs their care regularly and follows them in the community   MedAssist  [phone]) [phone]   [employer]  [phone]    Agencies that provide inexpensive medical care: Organization         Address  Phone   Notes  [facility]  [phone]) [phone]   [facility]    [phone]   [facility] [address] [place], [place] [address] [phone]) [phone]   [facility] [address], [place] [phone]   Planned Parenthood    [phone]   [facility]    [phone]   [facility]  [address], [place] Phone:  [phone](336) [phone], Fax:  [phone] Hours of Operation:  9 am - 6 pm, M-F.  Also accepts Medicaid/Medicare and self-pay.  [facility]  [address], [place] Phone: [phone], Fax: [phone]) [phone]. Hours of Operation:  8:30 am - 5:30 pm, M-F.   Also accepts Medicaid and self-pay.  [facility] [place] [address], [place] Phone: [phone]) [phone]   [facility] [address], [place], [place] [phone], Ext. [phone] Mondays & Thursdays: 7-9 AM.  First 15 patients are seen on a first come, first serve basis.    Medicaid-accepting [facility] Providers:  Organization         Address  Phone   Notes  [facility] [address], [place] [phone] Also accepts self-pay patients.  [facility] [address], [place]  [phone]) [phone]   [facility] [address], [place] [phone]   [facility] [address] [place] [address], [place] [phone]) [phone]   Renaye RakersVeita Bland [address]  [address], [place]   [phone] Only accepts [place] [employer] patients after they have their name applied to their card.   Self-Pay (no insurance) in [facility]:  Organization         Address  Phone   Notes  Sickle Cell Patients, [facility] [address] [place], [place] [phone]   [facility] [address] [place], [place] [phone]   [facility] [place]  [address], [address], [place] [phone]   [facility]/Dr. Osei-Bonsu  [address] [place] or [address], [place] [phone] Phone number for both [place] and [place] locations is the same.  [facility] [address], [place] [phone]   [facility] [address], [place] or [address] [phone]   [facility] [address], [place] [phone], phone; [phone], fax Sees patients 1st and 3rd Saturday of every month.  Must not qualify for public or private insurance (i.e. Medicaid, Medicare, [place] Health Choice, Veterans'  Benefits)  Household income should be no more than 200% of the poverty level The clinic cannot treat you if you are pregnant or think you are pregnant  Sexually transmitted diseases are not treated at the clinic.    Dental Care: Organization         Address  Phone  Notes  [facility] Department of [facility] [address] [place], [place] [phone] Accepts children up to age 69 who are enrolled in [place] or [place] Health Choice; pregnant women with a Medicaid card; and children who have applied for Medicaid or [place] Health Choice, but were declined, whose parents can pay a reduced fee at time of service.  [facility] of [facility]  [address], [place] [phone] Accepts children up to age 57 who are enrolled in [place] or [place] Health Choice; pregnant women with a Medicaid card; and children who have applied for Medicaid or [place] Health Choice, but were declined, whose parents can pay a reduced fee at time of service.  [facility]  [address] [place], [place] [phone] Patients are seen by appointment only. Walk-ins are not accepted. [facility] will see patients 37 years of age and older. Monday - Tuesday (8am-5pm) Most Wednesdays (8:30-5pm) $30 per visit, cash only  [facility] Adult [facility] Access PRO[facility]  [address], [facility] [phone] Patients are seen by appointment only. Walk-ins are not accepted. [facility] will see patients 1 years of age and older. One Wednesday Evening (Monthly: Volunteer Based).  $30 per visit, cash only  [employer]  [phone] for adults; Children under age 11, call Graduate Pediatric Dentistry at [phone]. Children aged 54-14, please call [phone] to request a pediatric application.  Dental services are provided in all areas of dental care including fillings, crowns and bridges, complete and partial  dentures, implants, gum treatment, root canals, and extractions. Preventive care is also provided. Treatment is provided to both adults and children. Patients are selected via a lottery and there is often a waiting list.   [facility] [address], [place]  [phone] [email or web address]   [facility] [address] [place], [place] [phone], Ext. [phone] Second and Fourth Thursday of  each month, opens at 6:30 AM; Clinic ends at 9 AM.  Patients are seen on a first-come first-served basis, and a limited number are seen during each clinic.   [facility]  [address], [place], [place] [phone]) [phone]   Eligibility Requirements You must have lived in [place], [place], or [place] for at least the last three months.   You cannot be eligible for state or federal sponsored National Cityhealthcare insurance, including CIGNAVeterans Administration, [place]icaid, or [employer]Medicare.   You generally cannot be eligible for healthcare insurance through your employer.    How to apply: Eligibility screenings are held every Tuesday and Wednesday afternoon from 1:00 pm until 4:00 pm. You do not need an appointment for the interview!  [facility] [address], [place], [place] [phone]   [facility]  [phone]   [facility]  [phone]   [facility]  [phone]    Behavioral Health Resources in the Community: Intensive Outpatient Programs Organization         Address  Phone  Notes  [facility] [address], [place], [place] [phone]   [facility] [address], [place], [place] [phone]   [facility] [address], [place], [place]  [phone]   [facility] [address],  [place], [place] [phone] or [phone]   Substance Abuse Resources Organization          Address  Phone  Notes  [facility] Drug Services  [phone]   [facility]  [phone]   [facility]  [phone]   [facility]  [phone]   Residential & Outpatient Substance Abuse Program  [phone]   Psychological Services Organization         Address  Phone  Notes  [facility]  [phone]- [phone]   [facility]  [phone]- [phone]   [facility] [address], [place] [phone] or [phone]    Mobile Crisis Teams Organization         Address  Phone  Notes  [facility], Mobile Crisis Care Unit  [phone]   [facility]  [address] [place], [place] [phone]   Doristine LocksSharon DeEsch [address] [place] [phone]    Self-Help/Support Groups Organization         Address  Phone             Notes  [facility]ental Health [facility]oc[facility] of [place] - variety of support groups  [phone] Call for more information  Narcotics Anonymous (NA), [facility] [address], [place]  2 meetings at this location   Statisticianesidential Treatment Programs Organization         Address  Phone  Notes  [facility] [address],    [place]  [phone]   [facility]  [address], [place] [phone], [place], [place] [phone]   [facility] [address], [place] [phone] Admissions: 8am-3pm M-F  [facility] [address]    [place], [place] [phone]   [facility] [address], [place], [place] [phone]   [facility] [address]  [place], [place] [phone]   [facility] - Intensive Outpatient [address], [place], [place] [phone]   [facility] ([facility] [address]  [place],  [place] [phone] or [phone]   [facility] ([facility]) [address] [place], [place]  [phone] Accepts Medicaid  [facility] [address]  [place] [phone] Substance Abuse/Addiction Treatment   [facility]al Health Resources Organization         Address  Phone  Notes  [facility]  [phone]   Angie FavaJulie Brannon, PhD [address], [address] [place], [place]   [phone](336) [phone] or [phone]   [facility] [place]   [address] [place], [place] [phone]) [phone]   [facility] [address], [place], [place] [phone]) [phone] Insurance/Medicaid/sponsorship through [facility]  [facility] Families [address]                                    [place], [place] [phone]) [phone] Therapy/tele-psych/case  [facility] [address] Falls.   [place], [place] [phone]) [phone]    Dr. Lolly MustacheArfeen  [phone]) [phone]   [facility] [place]  [employer] [facility] 1) [address], [place] 2) [address], [place] 3)  [address], [place] [phone]  [phone](336) [phone]   [facility] Child Abuse Hotline [phone]) [phone] or [phone](336) [phone] (After Hours)

## 2014-09-14 NOTE — ED Provider Notes (Signed)
CSN: [id number]     Arrival date & time 09/14/14  0016 History   First MD Initiated Contact with Patient 09/14/14 [phone]     Chief Complaint  Patient presents with  . Wrist Pain     (Consider location/radiation/quality/duration/timing/severity/associated sxs/prior Treatment) HPI  Patient presents with left wrist pain that has been gradually worsening x 1 week.  He has a hx of fracture of this wrist remotely but no recent injury.  Pain is sharp, over dorsal aspect diffusely, worse with movement, 10/10 intensity (pt awoken from sound sleep to take history).  Denies fevers, numbness or weakness of the extremity.    History reviewed. No pertinent past medical history. History reviewed. No pertinent past surgical history. History reviewed. No pertinent family history. History  Substance Use Topics  . Smoking status: Never Smoker   . Smokeless tobacco: Not on file  . Alcohol Use: No    Review of Systems  Constitutional: Negative for fever.  Musculoskeletal: Positive for arthralgias.  Skin: Negative for wound.  Allergic/Immunologic: Negative for immunocompromised state.  Neurological: Negative for weakness and numbness.  Hematological: Does not bruise/bleed easily.  Psychiatric/Behavioral: Negative for self-injury.      Allergies  Review of patient's allergies indicates no known allergies.  Home Medications   Prior to Admission medications   Not on File   BP 122/67  Pulse 88  Temp(Src) 98 F (36.7 C) (Oral)  Resp 20  SpO2 96% Physical Exam  Nursing note and vitals reviewed. Constitutional: He appears well-developed and well-nourished. No distress.  HENT:  Head: Normocephalic and atraumatic.  Neck: Neck supple.  Pulmonary/Chest: Effort normal.  Musculoskeletal:       Left wrist: Normal.       Left forearm: Normal.       Left hand: Normal.  Left wrist without tenderness.  No erythema, edema, warmth.  Distal sensation intact.  Strength 5/5 throughout.  Capillary  refill < 2 seconds.    Neurological: He is alert.  Skin: He is not diaphoretic.    ED Course  Procedures (including critical care time) Labs Review Labs Reviewed - No data to display  Imaging Review Dg Wrist Complete Left  09/14/2014   CLINICAL DATA:  Wrist pain since 09/06/2014, increasing. Swelling. No recent injury. Fracture about 20 years ago.  EXAM: LEFT WRIST - COMPLETE 3+ VIEW  COMPARISON:  03/21/2011  FINDINGS: Degenerative changes in the left wrist with narrowing and sclerosis in the radiocarpal joint. 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 Degenerative changes in the STT joints. Old osseous fragment adjacent to the distal scaphoid. Mild dorsal soft tissue swelling. No acute fracture or dislocation is identified. No destructive bone lesions.  IMPRESSION: Degenerative changes and old fracture deformities in the left wrist. Soft tissue swelling. No acute bony abnormalities.   Electronically Signed   By: Burman NievesWilliam  Stevens M.D.   On: 09/14/2014 00:57     EKG Interpretation None      MDM   Final diagnoses:  Left wrist pain    Afebrile, nontoxic patient with left wrist pain x 1 week.  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  Suspect pain related to this.  No clinical e/o gout or septic joint.   D/C home with velcro wrist splint, mobic, PCP follow up.  Discussed result, findings, treatment, and follow up  with patient.  Pt given return precautions.  Pt verbalizes understanding and agrees with plan.  [place] BeachEmily Zvi Duplantis, PA-C 09/14/14 [phone]

## 2014-09-14 NOTE — ED Provider Notes (Signed)
Medical screening examination/treatment/procedure(s) were performed by non-physician practitioner and as supervising physician I was immediately available for consultation/collaboration.   Dione Boozeavid Erma Raiche, MD 09/14/14 [phone]

## 2014-09-14 NOTE — ED Notes (Signed)
Pt is c/o left wrist pain  Denies injury but states it hurts to move  Pt states it started last Thursday and has progressively gotten worse

## 2014-11-12 ENCOUNTER — Emergency Department (INDEPENDENT_AMBULATORY_CARE_PROVIDER_SITE_OTHER)
Admission: EM | Admit: 2014-11-12 | Discharge: 2014-11-12 | Disposition: A | Discharge status: Home / Self Care | Payer: No Typology Code available for payment source | Source: Home / Self Care | Attending: Family Medicine | Admitting: Family Medicine

## 2014-11-12 ENCOUNTER — Encounter (HOSPITAL_COMMUNITY): Payer: Self-pay | Admitting: Emergency Medicine

## 2014-11-12 DIAGNOSIS — S39012D Strain of muscle, fascia and tendon of lower back, subsequent encounter: Secondary | ICD-10-CM

## 2014-11-12 MED ORDER — KETOROLAC TROMETHAMINE 60 MG/2ML IM SOLN
60.0000 mg | Freq: Once | INTRAMUSCULAR | Status: AC
  Administered 2014-11-12: 60 mg via INTRAMUSCULAR

## 2014-11-12 MED ORDER — CYCLOBENZAPRINE HCL 5 MG PO TABS: 5.0000 mg | ORAL_TABLET | Freq: Three times a day (TID) | ORAL | Status: DC | PRN

## 2014-11-12 MED ORDER — KETOROLAC TROMETHAMINE 10 MG PO TABS: 10.0000 mg | ORAL_TABLET | Freq: Four times a day (QID) | ORAL | Status: DC | PRN

## 2014-11-12 MED ORDER — KETOROLAC TROMETHAMINE 60 MG/2ML IM SOLN
INTRAMUSCULAR | Status: AC
  Filled 2014-11-12: qty 2

## 2014-11-12 NOTE — ED Notes (Signed)
C/o chronic lower back pain onset 1 week; getting worse Denies inj/trauma Steady gait; NAD Alert, no signs of acute distress.

## 2014-11-12 NOTE — ED Provider Notes (Signed)
CSN: [id number]     Arrival date & time 11/12/14  1813 History   First MD Initiated Contact with Patient 11/12/14 1912     Chief Complaint  Patient presents with  . Back Pain   (Consider location/radiation/quality/duration/timing/severity/associated sxs/prior Treatment) Patient is a 43 y.o. male presenting with back pain.  Back Pain Location:  Lumbar spine Quality:  Stiffness Radiates to:  Does not radiate Pain severity:  Moderate Onset quality:  Gradual Duration:  1 week Chronicity:  Recurrent Context: not lifting heavy objects and not recent injury   Relieved by:  None tried   History reviewed. No pertinent past medical history. History reviewed. No pertinent past surgical history. No family history on file. History  Substance Use Topics  . Smoking status: Never Smoker   . Smokeless tobacco: Not on file  . Alcohol Use: No    Review of Systems  Constitutional: Negative.   Cardiovascular: Negative for leg swelling.  Gastrointestinal: Negative.   Genitourinary: Negative.   Musculoskeletal: Positive for back pain. Negative for joint swelling and gait problem.    Allergies  Review of patient's allergies indicates no known allergies.  Home Medications   Prior to Admission medications   Medication Sig Start Date End Date Taking? Authorizing Provider  cyclobenzaprine (FLEXERIL) 5 MG tablet Take 1 tablet (5 mg total) by mouth 3 (three) times daily as needed for muscle spasms. 11/12/14   Linna HoffJames D Garner Dullea, MD  ketorolac (TORADOL) 10 MG tablet Take 1 tablet (10 mg total) by mouth every 6 (six) hours as needed. 11/12/14   Linna HoffJames D Karsen Nakanishi, MD  meloxicam (MOBIC) 7.5 MG tablet Take 1 tablet (7.5 mg total) by mouth daily. 09/14/14   Trixie DredgeEmily West, PA-C   BP 120/75 mmHg  Pulse 87  Temp(Src) 97.4 F (36.3 C) (Oral)  Resp 16  SpO2 95% Physical Exam  Constitutional: He is oriented to person, place, and time. He appears well-developed and well-nourished. He appears distressed.   Abdominal: Soft. Bowel sounds are normal.  Musculoskeletal: He exhibits tenderness.       Lumbar back: He exhibits decreased range of motion, tenderness, pain and spasm. He exhibits normal pulse.  Neurological: He is alert and oriented to person, place, and time.  Skin: Skin is warm and dry.  Nursing note and vitals reviewed.   ED Course  Procedures (including critical care time) Labs Review Labs Reviewed - No data to display  Imaging Review No results found.   MDM   1. Repetitive strain injury of lower back, subsequent encounter        Linna HoffJames D Talisa Petrak, MD 11/12/14 1941

## 2014-11-12 NOTE — Discharge Instructions (Signed)
Heat and medication and bedrest as needed, further care and evaluation by orthopedist.

## 2016-02-06 ENCOUNTER — Encounter (HOSPITAL_COMMUNITY): Payer: Self-pay

## 2016-02-06 ENCOUNTER — Emergency Department (HOSPITAL_COMMUNITY): Payer: BLUE CROSS/BLUE SHIELD

## 2016-02-06 ENCOUNTER — Emergency Department (HOSPITAL_COMMUNITY)
Admission: EM | Admit: 2016-02-06 | Discharge: 2016-02-06 | Disposition: A | Discharge status: Home / Self Care | Payer: BLUE CROSS/BLUE SHIELD | Attending: Emergency Medicine | Admitting: Emergency Medicine

## 2016-02-06 DIAGNOSIS — M79672 Pain in left foot: Secondary | ICD-10-CM | POA: Diagnosis not present

## 2016-02-06 DIAGNOSIS — Z87828 Personal history of other (healed) physical injury and trauma: Secondary | ICD-10-CM | POA: Insufficient documentation

## 2016-02-06 DIAGNOSIS — Z79899 Other long term (current) drug therapy: Secondary | ICD-10-CM | POA: Diagnosis not present

## 2016-02-06 MED ORDER — IBUPROFEN 800 MG PO TABS
800.0000 mg | ORAL_TABLET | Freq: Once | ORAL | Status: AC
  Administered 2016-02-06: 800 mg via ORAL
  Filled 2016-02-06: qty 1

## 2016-02-06 MED ORDER — IBUPROFEN 800 MG PO TABS: 800.0000 mg | ORAL_TABLET | Freq: Three times a day (TID) | ORAL | Status: DC

## 2016-02-06 NOTE — ED Provider Notes (Signed)
CSN: [id number]     Arrival date & time 02/06/16  [id number] History   First MD Initiated Contact with Patient 02/06/16 [phone]     Chief Complaint  Patient presents with  . Foot Pain     (Consider location/radiation/quality/duration/timing/severity/associated sxs/prior Treatment) HPI   Thomas Holt is a 45 y.o. male with no significant PMH who presents with 2 week hx of waxing and waning, moderate, non-radiating, left foot pain.  He describes the pain at the ball of his foot and the top.  He has not tried anything PTA.  He reports he did injury his foot in the shower about 1 month ago, but it began improving.  He states it feels like a throbbing, aching, and will swell.  He states it's worse when he's sitting, and gets better when walking or sitting.  He states it will occassionally swell and he gets tingling in his toes.  No heel pain.  Elevating his foot does not help.  Denies weakness, color changes, fever, chills, CP, SOB, or abdominal pain.  History reviewed. No pertinent past medical history. History reviewed. No pertinent past surgical history. History reviewed. No pertinent family history. Social History  Substance Use Topics  . Smoking status: Never Smoker   . Smokeless tobacco: None  . Alcohol Use: No    Review of Systems All other systems negative unless otherwise stated in HPI    Allergies  Review of patient's allergies indicates no known allergies.  Home Medications   Prior to Admission medications   Medication Sig Start Date End Date Taking? Authorizing Provider  Multiple Vitamin (MULTIVITAMIN WITH MINERALS) TABS tablet Take 1 tablet by mouth daily.   Yes Historical Provider, MD   BP 147/110 mmHg  Pulse 96  Temp(Src) 97.4 F (36.3 C) (Oral)  Resp 20  Ht 5\' 11"  (1.803 m)  Wt 121.11 kg  BMI 37.26 kg/m2  SpO2 100% Physical Exam  Constitutional: He is oriented to person, place, and time. He appears well-developed and well-nourished.  HENT:  Head: Normocephalic  and atraumatic.  Right Ear: External ear normal.  Left Ear: External ear normal.  Eyes: Conjunctivae are normal. No scleral icterus.  Neck: No tracheal deviation present.  Cardiovascular: Intact distal pulses.   Pulses:      Dorsalis pedis pulses are 2+ on the left side.       Posterior tibial pulses are 2+ on the left side.  Pulmonary/Chest: Effort normal. No respiratory distress.  Abdominal: He exhibits no distension.  Musculoskeletal: Normal range of motion. He exhibits tenderness.  FAROM of left ankle in dorsi/plantar flexion.  Able to move all toes without difficulty.  TTP along plantar foot between 1st and 2nd digits and mid dorsal foot.  No ecchymosis, erythema, swelling, induration, or fluctuance.  Left foot with good perfusion, warm.    Neurological: He is alert and oriented to person, place, and time.  Strength intact.  Sensation intact to light touch.   Skin: Skin is warm and dry.  Psychiatric: He has a normal mood and affect. His behavior is normal.    ED Course  Procedures (including critical care time) Labs Review Labs Reviewed - No data to display  Imaging Review Dg Foot Complete Left  02/06/2016  CLINICAL DATA:  Left foot pain.  Fall 1 month ago. EXAM: LEFT FOOT - COMPLETE 3+ VIEW COMPARISON:  None. FINDINGS: There is no evidence of fracture or dislocation. There is no evidence of arthropathy or other focal bone abnormality. Soft tissues are unremarkable.  IMPRESSION: Negative. Electronically Signed   By: Marlan Palau M.D.   On: 02/06/2016 07:43   I have personally reviewed and evaluated these images and lab results as part of my medical decision-making.   EKG Interpretation None      MDM   Final diagnoses:  Left foot pain   FAROM.  No swelling, erythema, or ecchymosis.  Good pulses.  Sensation intact. No color change, swelling, erythema, warmth.  Doubt PVD.  Doubt venous or arterial occlusion.  Doubt neuropathy.  Doubt cellulitis. Will obtain xrays to  evaluate for fracture or dislocation.  Patient given ibuprofen in ED.   Plain films negative for acute abnormality. Plan to discharge home with ibuprofen.  Follow up with orthopedics.  Discussed return precautions.  Patient agrees and acknowledges the above plan for discharge.     Cheri Fowler, PA-C 02/06/16 1610  Benjiman Core, MD 02/07/16 [phone]

## 2016-02-06 NOTE — Discharge Instructions (Signed)
1. Continue home medications. 2. Start taking ibuprofen as needed for pain.  Rest, Ice, Compression, Elevation 3.  Follow up with orthopedics if your symptoms persist.   Musculoskeletal Pain  Musculoskeletal pain is muscle and boney aches and pains. These pains can occur in any part of the body. Your caregiver may treat you without knowing the cause of the pain. They may treat you if blood or urine tests, X-rays, and other tests were normal.  CAUSES  There is often not a definite cause or reason for these pains. These pains may be caused by a type of germ (virus). The discomfort may also come from overuse. Overuse includes working out too hard when your body is not fit. Boney aches also come from weather changes. Bone is sensitive to atmospheric pressure changes.  HOME CARE INSTRUCTIONS  Ask when your test results will be ready. Make sure you get your test results.  Only take over-the-counter or prescription medicines for pain, discomfort, or fever as directed by your caregiver. If you were given medications for your condition, do not drive, operate machinery or power tools, or sign legal documents for 24 hours. Do not drink alcohol. Do not take sleeping pills or other medications that may interfere with treatment.  Continue all activities unless the activities cause more pain. When the pain lessens, slowly resume normal activities. Gradually increase the intensity and duration of the activities or exercise.  During periods of severe pain, bed rest may be helpful. Lay or sit in any position that is comfortable.  Putting ice on the injured area.  Put ice in a bag.  Place a towel between your skin and the bag.  Leave the ice on for 15 to 20 minutes, 3 to 4 times a day. Follow up with your caregiver for continued problems and no reason can be found for the pain. If the pain becomes worse or does not go away, it may be necessary to repeat tests or do additional testing. Your caregiver may need to look  further for a possible cause. SEEK IMMEDIATE MEDICAL CARE IF:  You have pain that is getting worse and is not relieved by medications.  You develop chest pain that is associated with shortness or breath, sweating, feeling sick to your stomach (nauseous), or throw up (vomit).  Your pain becomes localized to the abdomen.  You develop any new symptoms that seem different or that concern you. MAKE SURE YOU:  Understand these instructions.  Will watch your condition.  Will get help right away if you are not doing well or get worse. This information is not intended to replace advice given to you by your health care provider. Make sure you discuss any questions you have with your health care provider.  Document Released: 11/09/2005 Document Revised: 02/01/2012 Document Reviewed: 07/14/2013  Elsevier Interactive Patient Education [employer]2016 [employer] Inc.

## 2016-02-06 NOTE — ED Notes (Signed)
Pt injured his left foot about one month ago in the shower, he states that it swells at night and hurts to walk on it

## 2017-06-29 ENCOUNTER — Encounter (HOSPITAL_COMMUNITY): Payer: Self-pay | Admitting: Emergency Medicine

## 2017-06-29 ENCOUNTER — Ambulatory Visit (HOSPITAL_COMMUNITY)
Admission: EM | Admit: 2017-06-29 | Discharge: 2017-06-29 | Disposition: A | Discharge status: Home / Self Care | Payer: BLUE CROSS/BLUE SHIELD | Attending: Family Medicine | Admitting: Family Medicine

## 2017-06-29 DIAGNOSIS — R55 Syncope and collapse: Secondary | ICD-10-CM | POA: Diagnosis not present

## 2017-06-29 NOTE — ED Triage Notes (Signed)
[facility] a [facility] needing [facility] to return to work. The patient reported that 4 days ago he had a LOC at work. The patient stated that EMS evaluated him on scene. The patient denied any symptoms since.

## 2017-06-30 NOTE — ED Provider Notes (Signed)
  [facility]   [id number] 06/29/17 Arrival Time: 1749  ASSESSMENT & PLAN:  1. Fainting spell    Work note given. Return to work. Ensure adequate hydration.  Reviewed expectations re: course of current medical issues. Questions answered. Outlined signs and symptoms indicating need for more acute intervention. Patient verbalized understanding. After Visit Summary given.   SUBJECTIVE:  Malon Kindlenthony Aldape is a 46 y.o. male who presents who reports a fainting spell at work 4 days ago. Feels he was too hot and dehydrated. No injury. EMS called and evaluated. No hospital transfer. Pt reports feeling back to normal very shortly after and since. No further symptoms since episode. He is supposed to return to work [employer] and needs a doctor note saying that he is ok to work. No present concerns.  ROS: As per HPI.   OBJECTIVE:  Vitals:   06/29/17 1820  BP: (!) 142/87  Pulse: 81  Resp: 18  Temp: 98.1 F (36.7 C)  TempSrc: Oral  SpO2: 96%     General appearance: alert; no distress Lungs: clear to auscultation bilaterally Heart: regular rate and rhythm Extremities: no cyanosis or edema; symmetrical with no gross deformities Skin: warm and dry Neurologic: normal symmetric reflexes; normal gait Psychological:  alert and cooperative; normal mood and affect  No Known Allergies  PMHx, SurgHx, SocialHx, Medications, and Allergies were reviewed in the Visit Navigator and updated as appropriate.      Mardella LaymanHagler, Drury Ardizzone, MD 06/30/17 [phone]

## 2018-06-02 ENCOUNTER — Encounter (HOSPITAL_COMMUNITY): Payer: Self-pay | Admitting: Emergency Medicine

## 2018-06-02 ENCOUNTER — Ambulatory Visit (HOSPITAL_COMMUNITY)
Admission: EM | Admit: 2018-06-02 | Discharge: 2018-06-02 | Disposition: A | Discharge status: Home / Self Care | Payer: BLUE CROSS/BLUE SHIELD | Attending: Family Medicine | Admitting: Family Medicine

## 2018-06-02 DIAGNOSIS — M545 Low back pain, unspecified: Secondary | ICD-10-CM

## 2018-06-02 LAB — POCT URINALYSIS DIP (DEVICE)
Glucose, UA: NEGATIVE mg/dL
KETONES UR: NEGATIVE mg/dL
Leukocytes, UA: NEGATIVE
Nitrite: NEGATIVE
PH: 6.5 (ref 5.0–8.0)
PROTEIN: 30 mg/dL — AB
SPECIFIC GRAVITY, URINE: 1.025 (ref 1.005–1.030)
UROBILINOGEN UA: 0.2 mg/dL (ref 0.0–1.0)

## 2018-06-02 MED ORDER — DICLOFENAC SODIUM 75 MG PO TBEC: 75.0000 mg | DELAYED_RELEASE_TABLET | Freq: Two times a day (BID) | ORAL | 0 refills | Status: DC

## 2018-06-02 MED ORDER — CYCLOBENZAPRINE HCL 10 MG PO TABS: 10.0000 mg | ORAL_TABLET | Freq: Three times a day (TID) | ORAL | 0 refills | Status: DC

## 2018-06-02 NOTE — ED Provider Notes (Signed)
[facility]   [id number] 06/02/18 Arrival Time: 1534  ASSESSMENT & PLAN:  1. Acute bilateral low back pain without sciatica     Meds ordered this encounter  Medications  . diclofenac (VOLTAREN) 75 MG EC tablet    Sig: Take 1 tablet (75 mg total) by mouth 2 (two) times daily.    Dispense:  14 tablet    Refill:  0  . cyclobenzaprine (FLEXERIL) 10 MG tablet    Sig: Take 1 tablet (10 mg total) by mouth 3 (three) times daily.    Dispense:  20 tablet    Refill:  0   ROM as tolerated. Med sedation precautions. May f/u here if needed.  Reviewed expectations re: course of current medical issues. Questions answered. Outlined signs and symptoms indicating need for more acute intervention. Patient verbalized understanding. After Visit Summary given.   SUBJECTIVE: History from: patient.  Thomas Holt is a 47 y.o. male who presents with complaint of intermittent bilateral lower back discomfort. Onset gradual beginning this week. Injury/trama: no. History of back problems: occasional low back pain. Previous back surgery: no. Discomfort described as aching without radiation. Certain movements exacerbate the described discomfort. Better with rest. Extremity sensation changes or weakness: none. Ambulatory without difficulty. Normal bowel/bladder habits. No associated abdominal pain/n/v. Self treatment: has not tried OTCs for relief of pain. Questions mild R flank discomfort. No hematuria. No h/o kidney stones.  Patient reports no fevers, IV drug use, recent back surgeries or procedures, urinary incontinence, or bowel incontinence.  ROS: As per HPI.   OBJECTIVE:  Vitals:   06/02/18 1601  BP: 116/79  Pulse: 92  Resp: 18  Temp: 98.1 F (36.7 C)  SpO2: 96%    General appearance: alert; no distress Neck: supple with FROM; without midline tenderness Lungs: unlabored respirations; symmetrical air entry Abdomen: soft, non-tender; bowel sounds normal; no masses or organomegaly;  no guarding or rebound tenderness Back: bilateral lower tenderness present over lumbar musculature; FROM at hips with mild discomfort reported; bruising: none; without midline tenderness Extremities: no cyanosis or edema; symmetrical with no gross deformities Skin: warm and dry Neurologic: normal gait; normal symmetric reflexes; normal LE strength and sensation Psychological: alert and cooperative; normal mood and affect  Labs: Results for orders placed or performed during the hospital encounter of 06/02/18  POCT urinalysis dip (device)  Result Value Ref Range   Glucose, UA NEGATIVE NEGATIVE mg/dL   Bilirubin Urine SMALL (A) NEGATIVE   Ketones, ur NEGATIVE NEGATIVE mg/dL   Specific Gravity, Urine 1.025 1.005 - 1.030   Hgb urine dipstick TRACE (A) NEGATIVE   pH 6.5 5.0 - 8.0   Protein, ur 30 (A) NEGATIVE mg/dL   Urobilinogen, UA 0.2 0.0 - 1.0 mg/dL   Nitrite NEGATIVE NEGATIVE   Leukocytes, UA NEGATIVE NEGATIVE   Labs Reviewed  POCT URINALYSIS DIP (DEVICE) - Abnormal; Notable for the following components:      Result Value   Bilirubin Urine SMALL (*)    Hgb urine dipstick TRACE (*)    Protein, ur 30 (*)    All other components within normal limits    No Known Allergies  History reviewed. No pertinent past medical history. Social History   Socioeconomic History  . Marital status: Divorced    Spouse name: Not on file  . Number of children: Not on file  . Years of education: Not on file  . Highest education level: Not on file  Occupational History  . Not on file  Social Needs  .  Financial resource strain: Not on file  . Food insecurity:    Worry: Not on file    Inability: Not on file  . Transportation needs:    Medical: Not on file    Non-medical: Not on file  Tobacco Use  . Smoking status: Never Smoker  Substance and Sexual Activity  . Alcohol use: No  . Drug use: No  . Sexual activity: Yes    Birth control/protection: Condom  Lifestyle  . Physical activity:     Days per week: Not on file    Minutes per session: Not on file  . Stress: Not on file  Relationships  . Social connections:    Talks on phone: Not on file    Gets together: Not on file    Attends religious service: Not on file    Active member of club or organization: Not on file    Attends meetings of clubs or organizations: Not on file    Relationship status: Not on file  . Intimate partner violence:    Fear of current or ex partner: Not on file    Emotionally abused: Not on file    Physically abused: Not on file    Forced sexual activity: Not on file  Other Topics Concern  . Not on file  Social History Narrative  . Not on file   No family history on file. History reviewed. No pertinent surgical history.   Mardella LaymanHagler, Circe Chilton, MD 06/15/18 [phone]

## 2018-06-02 NOTE — ED Triage Notes (Signed)
Pt c/o back pain, worse with movement. Pt also c/o R flank pain.

## 2018-06-02 NOTE — Discharge Instructions (Addendum)

## 2021-01-03 ENCOUNTER — Other Ambulatory Visit: Payer: Self-pay

## 2021-01-03 ENCOUNTER — Emergency Department (HOSPITAL_COMMUNITY)
Admission: EM | Admit: 2021-01-03 | Discharge: 2021-01-03 | Disposition: A | Discharge status: Home / Self Care | Payer: BLUE CROSS/BLUE SHIELD | Attending: Emergency Medicine | Admitting: Emergency Medicine

## 2021-01-03 ENCOUNTER — Encounter (HOSPITAL_COMMUNITY): Payer: Self-pay

## 2021-01-03 DIAGNOSIS — I10 Essential (primary) hypertension: Secondary | ICD-10-CM | POA: Insufficient documentation

## 2021-01-03 LAB — CBC
HCT: 50.7 % (ref 39.0–52.0)
Hemoglobin: 16 g/dL (ref 13.0–17.0)
MCH: 29.9 pg (ref 26.0–34.0)
MCHC: 31.6 g/dL (ref 30.0–36.0)
MCV: 94.6 fL (ref 80.0–100.0)
Platelets: 205 10*3/uL (ref 150–400)
RBC: 5.36 MIL/uL (ref 4.22–5.81)
RDW: 11.8 % (ref 11.5–15.5)
WBC: 11.8 10*3/uL — ABNORMAL HIGH (ref 4.0–10.5)
nRBC: 0 % (ref 0.0–0.2)

## 2021-01-03 LAB — BASIC METABOLIC PANEL
Anion gap: 9 (ref 5–15)
BUN: 19 mg/dL (ref 6–20)
CO2: 28 mmol/L (ref 22–32)
Calcium: 9.1 mg/dL (ref 8.9–10.3)
Chloride: 102 mmol/L (ref 98–111)
Creatinine, Ser: 1.1 mg/dL (ref 0.61–1.24)
GFR, Estimated: >60 mL/min (ref >60)
Glucose, Bld: 101 mg/dL — ABNORMAL HIGH (ref 70–99)
Potassium: 5 mmol/L (ref 3.5–5.1)
Sodium: 139 mmol/L (ref 135–145)

## 2021-01-03 MED ORDER — HYDROCHLOROTHIAZIDE 25 MG PO TABS: 25.0000 mg | ORAL_TABLET | Freq: Every day | ORAL | 0 refills | Status: DC

## 2021-01-03 NOTE — ED Triage Notes (Signed)
Pt reports headache earlier today. Pt states that he checked his BP earlier today and it was 168/94. Pt states that he is worried about his BP being high and wants to be checked out.

## 2021-01-03 NOTE — ED Provider Notes (Signed)
Emergency Department Provider Note   I have reviewed the triage vital signs and the nursing notes.   HISTORY  Chief Complaint Hypertension   HPI Thomas Holt is a 50 y.o. male with unknown past medical history presents to the emergency department with elevated blood pressure.  Triage note describes a headache earlier today the patient denies this.  He states that his mother got a new blood pressure machine at home and that they were all taking her blood pressures.  He states he does not take his blood pressure regularly and thinks the last time he took it was approximately 3 months ago at which point the top number was 119.  He states he is put on weight after going part-time at work and has not been exercising very much.  He eats a diet relatively high in processed food and salt.  He denies any chest pain, shortness of breath, weakness/numbness.  No active symptoms at this time.  He does not have a primary care doctor.  No radiation of symptoms or other modifying factors.  History reviewed. No pertinent past medical history.  There are no problems to display for this patient.   History reviewed. No pertinent surgical history.  Allergies Patient has no known allergies.  History reviewed. No pertinent family history.  Social History Social History   Tobacco Use  . Smoking status: Never Smoker  Substance Use Topics  . Alcohol use: No  . Drug use: No    Review of Systems  Constitutional: No fever/chills Eyes: No visual changes. ENT: No sore throat. Cardiovascular: Denies chest pain. Respiratory: Denies shortness of breath. Gastrointestinal: No abdominal pain.  No nausea, no vomiting.  No diarrhea.  No constipation. Genitourinary: Negative for dysuria. Musculoskeletal: Negative for back pain. Skin: Negative for rash. Neurological: Negative for headaches, focal weakness or numbness.  10-point ROS otherwise  negative.  ____________________________________________   PHYSICAL EXAM:  VITAL SIGNS: ED Triage Vitals  Enc Vitals Group     BP 01/03/21 1900 (!) 182/112     Pulse Rate 01/03/21 1900 86     Resp 01/03/21 1900 18     Temp 01/03/21 1900 98.3 F (36.8 C)     Temp Source 01/03/21 1900 Oral     SpO2 01/03/21 1900 96 %   Constitutional: Alert and oriented. Well appearing and in no acute distress. Eyes: Conjunctivae are normal.  Head: Atraumatic. Nose: No congestion/rhinnorhea. Mouth/Throat: Mucous membranes are moist.   Neck: No stridor.  Cardiovascular: Normal rate, regular rhythm. Good peripheral circulation. Grossly normal heart sounds.   Respiratory: Normal respiratory effort.  No retractions. Lungs CTAB. Gastrointestinal: Soft and nontender. No distention.  Musculoskeletal: No lower extremity tenderness with trace pitting edema. No gross deformities of extremities. Neurologic:  Normal speech and language. Skin:  Skin is warm, dry and intact. No rash noted.   ____________________________________________   LABS (all labs ordered are listed, but only abnormal results are displayed)  Labs Reviewed  BASIC METABOLIC PANEL - Abnormal; Notable for the following components:      Result Value   Glucose, Bld 101 (*)    All other components within normal limits  CBC - Abnormal; Notable for the following components:   WBC 11.8 (*)    All other components within normal limits   ____________________________________________   PROCEDURES  Procedure(s) performed:   Procedures  None  ____________________________________________   INITIAL IMPRESSION / ASSESSMENT AND PLAN / ED COURSE  Pertinent labs & imaging results that were available during  my care of the patient were reviewed by me and considered in my medical decision making (see chart for details).   Patient presents to the emergency department with asymptomatic hypertension.  Blood pressures here are elevated but no  findings to suspect acute hypertensive emergency.  No headache.  Patient does have some trace pitting edema in the bilateral lower extremities.  We had a discussion regarding the DASHdiet and weight loss as potential treatments for this.  He is electing to start HCTZ here.  Called in a 1 month prescription to the pharmacy but advised that he needs close PCP follow-up to continue following his blood pressure and perform other medical screening and health maintenance type activities.   ____________________________________________  FINAL CLINICAL IMPRESSION(S) / ED DIAGNOSES  Final diagnoses:  Hypertension, unspecified type    NEW OUTPATIENT MEDICATIONS STARTED DURING THIS VISIT:  New Prescriptions   HYDROCHLOROTHIAZIDE (HYDRODIURIL) 25 MG TABLET    Take 1 tablet (25 mg total) by mouth daily.    Note:  This document was prepared using Dragon voice recognition software and may include unintentional dictation errors.  Alona Bene, MD, [facility] Emergency Medicine    Revanth Neidig, Arlyss Repress, MD 01/03/21 [phone]

## 2021-01-03 NOTE — Discharge Instructions (Signed)
You were seen in the ED today with high blood pressure. We are starting some blood pressure medication at home but it is important to establish care with a primary care doctor. Return to the ED with any chest pain, shortness of breath, weakness/numbness, or changes in vision.

## 2021-01-03 NOTE — ED Notes (Signed)
Lt grn, drk grn, lav sent to lab. [employer]

## 2021-06-17 ENCOUNTER — Encounter (HOSPITAL_COMMUNITY): Payer: Self-pay

## 2021-06-17 ENCOUNTER — Other Ambulatory Visit: Payer: Self-pay

## 2021-06-17 ENCOUNTER — Emergency Department (HOSPITAL_COMMUNITY)
Admission: EM | Admit: 2021-06-17 | Discharge: 2021-06-18 | Disposition: A | Discharge status: Home / Self Care | Payer: 59 | Attending: Emergency Medicine | Admitting: Emergency Medicine

## 2021-06-17 DIAGNOSIS — R519 Headache, unspecified: Secondary | ICD-10-CM | POA: Diagnosis not present

## 2021-06-17 NOTE — ED Provider Notes (Signed)
[facility]-EMERGENCY DEPT Provider Note   CSN: [id number] Arrival date & time: 06/17/21  2047     History Chief Complaint  Patient presents with   Pressure in head    Thomas Holt is a 50 y.o. male presents to the Emergency Department complaining of acute episode of sharp pain behind his left eye.  He reports it happened about 6pm.  States it lasted less than 2 seconds and resolved completely. It has not returned.  Pt reports this happens occasionally.  Denies falls, head trauma.  Pt denies vision changes, numbness, weakness, LOC, dizziness, nausea, vomiting or any other accompanying symptom when these episodes happen.  Denies specific aggravating or alleviating factors.  No return or symptoms or development of other symptoms since this happened. He reports he was at work and told them that he wanted to be "checked."  Pt reports he will need a work note.     The history is provided by the patient and medical records. No language interpreter was used.      History reviewed. No pertinent past medical history.  There are no problems to display for this patient.   History reviewed. No pertinent surgical history.     History reviewed. No pertinent family history.  Social History   Tobacco Use   Smoking status: Never  Substance Use Topics   Alcohol use: No   Drug use: No    Home Medications Prior to Admission medications   Medication Sig Start Date End Date Taking? Authorizing Provider  cyclobenzaprine (FLEXERIL) 10 MG tablet Take 1 tablet (10 mg total) by mouth 3 (three) times daily. 06/02/18   Mardella Layman, MD  diclofenac (VOLTAREN) 75 MG EC tablet Take 1 tablet (75 mg total) by mouth 2 (two) times daily. 06/02/18   Mardella Layman, MD  hydrochlorothiazide (HYDRODIURIL) 25 MG tablet Take 1 tablet (25 mg total) by mouth daily. 01/03/21 02/02/21  Long, Arlyss Repress, MD    Allergies    Patient has no known allergies.  Review of Systems   Review of Systems   Constitutional:  Negative for appetite change, diaphoresis, fatigue, fever and unexpected weight change.  HENT:  Negative for mouth sores.   Eyes:  Negative for visual disturbance.  Respiratory:  Negative for cough, chest tightness, shortness of breath and wheezing.   Cardiovascular:  Negative for chest pain.  Gastrointestinal:  Negative for abdominal pain, constipation, diarrhea, nausea and vomiting.  Endocrine: Negative for polydipsia, polyphagia and polyuria.  Genitourinary:  Negative for dysuria, frequency, hematuria and urgency.  Musculoskeletal:  Negative for back pain and neck stiffness.  Skin:  Negative for rash.  Allergic/Immunologic: Negative for immunocompromised state.  Neurological:  Positive for headaches. Negative for syncope and light-headedness.  Hematological:  Does not bruise/bleed easily.  Psychiatric/Behavioral:  Negative for sleep disturbance. The patient is not nervous/anxious.    Physical Exam Updated Vital Signs BP 129/83 (BP Location: Left Arm)   Pulse 79   Temp (!) 97.4 F (36.3 C) (Oral)   Resp 16   SpO2 98%   Physical Exam Vitals and nursing note reviewed.  Constitutional:      General: He is not in acute distress.    Appearance: He is well-developed. He is not diaphoretic.  HENT:     Head: Normocephalic and atraumatic.     Nose: Nose normal.     Mouth/Throat:     Mouth: Mucous membranes are moist.  Eyes:     General: No scleral icterus.  Conjunctiva/sclera: Conjunctivae normal.     Pupils: Pupils are equal, round, and reactive to light.     Comments: No horizontal, vertical or rotational nystagmus  Neck:     Comments: Full active and passive ROM without pain No midline or paraspinal tenderness No nuchal rigidity or meningeal signs Cardiovascular:     Rate and Rhythm: Normal rate and regular rhythm.  Pulmonary:     Effort: Pulmonary effort is normal. No respiratory distress.     Breath sounds: No wheezing or rales.  Abdominal:      General: There is no distension.     Palpations: Abdomen is soft.     Tenderness: There is no abdominal tenderness. There is no guarding or rebound.  Musculoskeletal:        General: Normal range of motion.     Cervical back: Normal range of motion and neck supple.  Lymphadenopathy:     Cervical: No cervical adenopathy.  Skin:    General: Skin is warm and dry.     Findings: No rash.  Neurological:     Mental Status: He is alert and oriented to person, place, and time.     Cranial Nerves: No cranial nerve deficit.     Motor: No abnormal muscle tone.     Coordination: Coordination normal.     Comments: Mental Status:  Alert, oriented, thought content appropriate. Speech fluent without evidence of aphasia. Able to follow 2 step commands without difficulty.  Cranial Nerves:  II:  Peripheral visual fields grossly normal, pupils equal, round, reactive to light III,IV, VI: ptosis not present, extra-ocular motions intact bilaterally  V,VII: smile symmetric, facial light touch sensation equal VIII: hearing grossly normal bilaterally  IX,X: midline uvula rise  XI: bilateral shoulder shrug equal and strong XII: midline tongue extension  Motor:  5/5 in upper and lower extremities bilaterally including strong and equal grip strength and dorsiflexion/plantar flexion Sensory: Pinprick and light touch normal in all extremities.  Cerebellar: normal finger-to-nose with bilateral upper extremities Gait: normal gait and balance CV: distal pulses palpable throughout   Psychiatric:        Behavior: Behavior normal.        Thought Content: Thought content normal.        Judgment: Judgment normal.    ED Results / Procedures / Treatments    Procedures Procedures   Medications Ordered in ED Medications - No data to display  ED Course  I have reviewed the triage vital signs and the nursing notes.  Pertinent labs & imaging results that were available during my care of the patient were reviewed  by me and considered in my medical decision making (see chart for details).    MDM Rules/Calculators/A&P                           Patient here with 2 seconds of headache and no other symptoms.  Has not returned in the last 6 hours.  Normal neurologic exam.  Patient actively eating snacks when I enter the room.  Highly doubt subarachnoid hemorrhage.  No imaging indicated at this time.  Patient will need close follow-up with primary care.  Patient feels comfortable with this plan.  Did request a work note.   Final Clinical Impression(s) / ED Diagnoses Final diagnoses:  Nonintractable headache, unspecified chronicity pattern, unspecified headache type    Rx / DC Orders ED Discharge Orders     None  Herby Amick, Boyd Kerbs 06/18/21 0006    Melene Plan, DO 06/18/21 0009

## 2021-06-17 NOTE — ED Triage Notes (Signed)
Pt states that he had pressure behind his eyes around 5 pm. Pt states that he feels better.

## 2021-06-18 NOTE — Discharge Instructions (Signed)
1. Medications: usual home medications 2. Treatment: rest, drink plenty of fluids,  3. Follow Up: Please followup with your primary doctor in 1-2 days for discussion of your diagnoses and further evaluation after today's visit; if you do not have a primary care doctor use the resource guide provided to find one; Please return to the ER for return of headache or other concerns

## 2021-09-15 DIAGNOSIS — K4090 Unilateral inguinal hernia, without obstruction or gangrene, not specified as recurrent: Secondary | ICD-10-CM | POA: Insufficient documentation

## 2021-09-15 DIAGNOSIS — R1030 Lower abdominal pain, unspecified: Secondary | ICD-10-CM | POA: Diagnosis present

## 2021-09-16 ENCOUNTER — Other Ambulatory Visit: Payer: Self-pay

## 2021-09-16 ENCOUNTER — Encounter (HOSPITAL_COMMUNITY): Payer: Self-pay | Admitting: Emergency Medicine

## 2021-09-16 ENCOUNTER — Emergency Department (HOSPITAL_COMMUNITY): Payer: 59

## 2021-09-16 ENCOUNTER — Emergency Department (HOSPITAL_COMMUNITY)
Admission: EM | Admit: 2021-09-16 | Discharge: 2021-09-16 | Disposition: A | Discharge status: Home / Self Care | Payer: 59 | Attending: Emergency Medicine | Admitting: Emergency Medicine

## 2021-09-16 DIAGNOSIS — K4090 Unilateral inguinal hernia, without obstruction or gangrene, not specified as recurrent: Secondary | ICD-10-CM

## 2021-09-16 NOTE — ED Provider Notes (Deleted)
Emergency Medicine Provider Triage Evaluation Note  Thomas Holt , a 50 y.o. male  was evaluated in triage.  Pt complains of right sided groin pain onset approx 1 week ago.  Pt reports pain is worse with standing at work.  Pt reports when the pain is present he also has abdominal pain.  Pt reports it swells just above the scrotum.  Sitting makes the pain better and it goes away with laying.  No treatments PTA.  Denies hx of hernia.  Pt reports hx of appendectomy.   Review of Systems  Positive: Right sided groin pain Negative: N/V, dysuria  Physical Exam  BP 133/88   Pulse 67   Temp 97.9 F (36.6 C) (Oral)   Resp 17   Ht 5\' 11"  (1.803 m)   Wt 93 kg   SpO2 98%   BMI 28.59 kg/m  Gen:   Awake, no distress   Resp:  Normal effort  MSK:   Moves extremities without difficulty  Other:  GU exam deferred due to lack of chaperone.  Medical Decision Making  Medically screening exam initiated at 1:30 AM.  Appropriate orders placed.  Thomas Holt was informed that the remainder of the evaluation will be completed by another provider, this initial triage assessment does not replace that evaluation, and the importance of remaining in the ED until their evaluation is complete.  Groin pain. UA and Thomas Holt pending.   Thomas Holt, [place], Thomas Holt 09/16/21 0132

## 2021-09-16 NOTE — ED Triage Notes (Signed)
Patient presents with right sided inguinal pain and swelling. No fevers, chills, nausea or vomiting. No testicle pain

## 2021-09-16 NOTE — ED Provider Notes (Signed)
[facility]-EMERGENCY DEPT Provider Note   CSN: [id number] Arrival date & time: 09/15/21  2347     History Chief Complaint  Patient presents with   Hernia    Thomas Holt is a 50 y.o. male presents with complaints of right sided groin pain onset approx 1 week ago.  Pt reports pain is worse with standing at work and lifting heavy objects.  Reports the pain is more of a discomfort and fullness in the right pelvis.  Pt reports when the pain is present he also has abdominal pressure.  Reports normal urination and bowel movements..  Pt reports it swells just above the scrotum.  Denies pain in the testicles or scrotum.  Denies swelling of the scrotum.  Denies penile discharge.  Sitting makes the pain better and it goes away with laying.  No treatments PTA.  Denies hx of hernia.  Pt reports hx of appendectomy.   The history is provided by the patient and medical records. No language interpreter was used.      History reviewed. No pertinent past medical history.  There are no problems to display for this patient.   History reviewed. No pertinent surgical history.     No family history on file.  Social History   Tobacco Use   Smoking status: Never  Substance Use Topics   Alcohol use: No   Drug use: No    Home Medications Prior to Admission medications   Medication Sig Start Date End Date Taking? Authorizing Provider  cyclobenzaprine (FLEXERIL) 10 MG tablet Take 1 tablet (10 mg total) by mouth 3 (three) times daily. 06/02/18   Mardella Layman, MD  diclofenac (VOLTAREN) 75 MG EC tablet Take 1 tablet (75 mg total) by mouth 2 (two) times daily. 06/02/18   Mardella Layman, MD  hydrochlorothiazide (HYDRODIURIL) 25 MG tablet Take 1 tablet (25 mg total) by mouth daily. 01/03/21 02/02/21  Long, Arlyss Repress, MD    Allergies    Patient has no known allergies.  Review of Systems   Review of Systems  Constitutional:  Negative for appetite change, diaphoresis, fatigue, fever  and unexpected weight change.  HENT:  Negative for mouth sores.   Eyes:  Negative for visual disturbance.  Respiratory:  Negative for cough, chest tightness, shortness of breath and wheezing.   Cardiovascular:  Negative for chest pain.  Gastrointestinal:  Negative for abdominal pain, constipation, diarrhea, nausea and vomiting.  Endocrine: Negative for polydipsia, polyphagia and polyuria.  Genitourinary:  Negative for dysuria, frequency, genital sores, hematuria, penile discharge, penile pain, penile swelling, scrotal swelling, testicular pain and urgency.       Groin pain  Musculoskeletal:  Negative for back pain and neck stiffness.  Skin:  Negative for rash.  Allergic/Immunologic: Negative for immunocompromised state.  Neurological:  Negative for syncope, light-headedness and headaches.  Hematological:  Does not bruise/bleed easily.  Psychiatric/Behavioral:  Negative for sleep disturbance. The patient is not nervous/anxious.    Physical Exam Updated Vital Signs BP 133/88   Pulse 67   Temp 97.9 F (36.6 C) (Oral)   Resp 17   Ht 5\' 11"  (1.803 m)   Wt 93 kg   SpO2 98%   BMI 28.59 kg/m   Physical Exam Vitals and nursing note reviewed. Exam conducted with a chaperone present.  Constitutional:      General: He is not in acute distress.    Appearance: He is not diaphoretic.  HENT:     Head: Normocephalic.  Eyes:  General: No scleral icterus.    Conjunctiva/sclera: Conjunctivae normal.  Cardiovascular:     Rate and Rhythm: Normal rate and regular rhythm.     Pulses: Normal pulses.          Radial pulses are 2+ on the right side and 2+ on the left side.  Pulmonary:     Effort: No tachypnea, accessory muscle usage, prolonged expiration, respiratory distress or retractions.     Breath sounds: No stridor.     Comments: Equal chest rise. No increased work of breathing. Abdominal:     General: There is no distension.     Palpations: Abdomen is soft.     Tenderness: There is  no abdominal tenderness. There is no guarding or rebound.     Hernia: A hernia is present. Hernia is present in the right inguinal area. There is no hernia in the left inguinal area.  Genitourinary:    Penis: Normal and circumcised. No tenderness or discharge.      Testes: Normal. Cremasteric reflex is present.     Epididymis:     Right: Normal.     Left: Normal.     Comments: Right-sided inguinal hernia.  Easily reduced.  Nontender.  Musculoskeletal:     Cervical back: Normal range of motion.     Comments: Moves all extremities equally and without difficulty.  Lymphadenopathy:     Lower Body: No right inguinal adenopathy. No left inguinal adenopathy.  Skin:    General: Skin is warm and dry.     Capillary Refill: Capillary refill takes less than 2 seconds.  Neurological:     Mental Status: He is alert.     GCS: GCS eye subscore is 4. GCS verbal subscore is 5. GCS motor subscore is 6.     Comments: Speech is clear and goal oriented.  Psychiatric:        Mood and Affect: Mood normal.    ED Results / Procedures / Treatments   Labs (all labs ordered are listed, but only abnormal results are displayed) Labs Reviewed - No data to display   EKG None  Radiology No results found.  Procedures Procedures   Medications Ordered in ED Medications - No data to display  ED Course  I have reviewed the triage vital signs and the nursing notes.  Pertinent labs & imaging results that were available during my care of the patient were reviewed by me and considered in my medical decision making (see chart for details).    MDM Rules/Calculators/A&P                           Patient presents to the emergency department with intermittent swelling of the right side of his groin.  Reports fullness as opposed to acute pain.  No nausea or vomiting.  No fevers or chills.  No urinary symptoms.  No testicular symptoms.  On exam, patient with right-sided inguinal hernia.  It is not incarcerated or  strangulated.  He is otherwise well-appearing.  No clinical evidence of testicular torsion or epididymitis.  Patient will be discharged with general surgery follow-up.  Discussed reasons to return to the emergency department.  States understanding and is in agreement with the plan.   Final Clinical Impression(s) / ED Diagnoses Final diagnoses:  Right inguinal hernia    Rx / DC Orders ED Discharge Orders     None        Katianna Mcclenney, Boyd Kerbs 09/16/21 0148  Dione Booze, MD 09/16/21 [id number]

## 2021-09-16 NOTE — Discharge Instructions (Signed)
1. Medications: usual home medications 2. Treatment: rest, drink plenty of fluids,  3. Follow Up: Please followup with General Surgery in 7 days for discussion of your diagnoses and further evaluation after today's visit; if you do not have a primary care doctor use the resource guide provided to find one; Please return to the ER for severe pain, fever or vomiting

## 2022-01-21 ENCOUNTER — Other Ambulatory Visit: Payer: Self-pay | Admitting: Critical Care Medicine

## 2022-01-21 ENCOUNTER — Encounter: Payer: Self-pay | Admitting: Physician Assistant

## 2022-01-21 DIAGNOSIS — K4090 Unilateral inguinal hernia, without obstruction or gangrene, not specified as recurrent: Secondary | ICD-10-CM

## 2022-01-21 MED ORDER — FLUTICASONE PROPIONATE 50 MCG/ACT NA SUSP: 2.0000 | Freq: Every day | NASAL | 6 refills | Status: DC

## 2022-01-21 MED ORDER — AMOXICILLIN-POT CLAVULANATE 875-125 MG PO TABS: 1.0000 | ORAL_TABLET | Freq: Two times a day (BID) | ORAL | 0 refills | Status: DC

## 2022-01-21 NOTE — Progress Notes (Signed)
Pt seen by Dr Clayburn Pert. ? ?He is worried that his BP is high.  ? ?He has been told he has a hernia, it bothers him at times, but when he went to the ER, he was supposed to f/u with surgery, but never heard from them.  ? ?He has been having R HA, he has sinusitis, needs ABX and nasal spray. Augmentin and Flonase.  ? ?He is off all medications. Had side effects from the HCTZ.  ? ?Does no smoke. ? ?119/70, 79, 95%, wt 189 lbs ? ?Has an inguinal hernia, easily reducible. Will be referred to the surgeons. ? ?Rosaria Ferries, PA-C ?01/21/2022 ?3:01 PM ? ? ?  ? ? ? ? ? ?

## 2022-03-04 ENCOUNTER — Other Ambulatory Visit: Payer: Self-pay

## 2022-03-04 ENCOUNTER — Encounter (HOSPITAL_COMMUNITY): Payer: Self-pay | Admitting: *Deleted

## 2022-03-04 ENCOUNTER — Emergency Department (HOSPITAL_COMMUNITY)
Admission: EM | Admit: 2022-03-04 | Discharge: 2022-03-04 | Disposition: A | Discharge status: Home / Self Care | Payer: 59 | Attending: Emergency Medicine | Admitting: Emergency Medicine

## 2022-03-04 DIAGNOSIS — R55 Syncope and collapse: Secondary | ICD-10-CM | POA: Diagnosis present

## 2022-03-04 DIAGNOSIS — R739 Hyperglycemia, unspecified: Secondary | ICD-10-CM | POA: Diagnosis not present

## 2022-03-04 LAB — URINALYSIS, ROUTINE W REFLEX MICROSCOPIC
Bilirubin Urine: NEGATIVE
Glucose, UA: NEGATIVE mg/dL
Hgb urine dipstick: NEGATIVE
Ketones, ur: 20 mg/dL — AB
Leukocytes,Ua: NEGATIVE
Nitrite: NEGATIVE
Protein, ur: NEGATIVE mg/dL
Specific Gravity, Urine: 1.027 (ref 1.005–1.030)
pH: 5 (ref 5.0–8.0)

## 2022-03-04 LAB — BASIC METABOLIC PANEL
Anion gap: 6 (ref 5–15)
BUN: 14 mg/dL (ref 6–20)
CO2: 24 mmol/L (ref 22–32)
Calcium: 8.9 mg/dL (ref 8.9–10.3)
Chloride: 107 mmol/L (ref 98–111)
Creatinine, Ser: 0.98 mg/dL (ref 0.61–1.24)
GFR, Estimated: >60 mL/min (ref >60)
Glucose, Bld: 231 mg/dL — ABNORMAL HIGH (ref 70–99)
Potassium: 3.7 mmol/L (ref 3.5–5.1)
Sodium: 137 mmol/L (ref 135–145)

## 2022-03-04 LAB — CBC
HCT: 45.5 % (ref 39.0–52.0)
Hemoglobin: 15.1 g/dL (ref 13.0–17.0)
MCH: 30.6 pg (ref 26.0–34.0)
MCHC: 33.2 g/dL (ref 30.0–36.0)
MCV: 92.3 fL (ref 80.0–100.0)
Platelets: 212 10*3/uL (ref 150–400)
RBC: 4.93 MIL/uL (ref 4.22–5.81)
RDW: 11.5 % (ref 11.5–15.5)
WBC: 8.6 10*3/uL (ref 4.0–10.5)
nRBC: 0 % (ref 0.0–0.2)

## 2022-03-04 LAB — CBG MONITORING, ED: Glucose-Capillary: 217 mg/dL — ABNORMAL HIGH (ref 70–99)

## 2022-03-04 NOTE — Discharge Instructions (Signed)
Make sure to eat and drink regularly throughout the day.  Keep snacks on hand in case you start feeling weak. ?Follow-up with your primary care doctor. ?Return to the ED for new or worsening symptoms. ?

## 2022-03-04 NOTE — ED Provider Triage Note (Signed)
Emergency Medicine Provider Triage Evaluation Note ? ?Thomas Holt , a 51 y.o. male  was evaluated in triage.  Pt complains of weakness. ? ?Review of Systems  ?Positive: Weakness, abd discomfort ?Negative: Fever, n/v/d, dysuria, cp, sob ? ?Physical Exam  ?BP 119/69 (BP Location: Right Arm)   Pulse 79   Temp 97.8 ?F (36.6 ?C)   Resp 17   SpO2 98%  ?Gen:   Awake, no distress   ?Resp:  Normal effort  ?MSK:   Moves extremities without difficulty  ?Other:   ? ?Medical Decision Making  ?Medically screening exam initiated at 1:16 PM.  Appropriate orders placed.  Thomas Holt was informed that the remainder of the evaluation will be completed by another provider, this initial triage assessment does not replace that evaluation, and the importance of remaining in the ED until their evaluation is complete. ? ?Poor historian, felt a bit weak this AM.  Report intermittent R side abd pain.  Report having normal blood work recently but wants to make sure everything is alright. ?  ?Fayrene Helper, PA-C ?03/04/22 1327 ? ?

## 2022-03-04 NOTE — ED Provider Notes (Signed)
?[facility] EMERGENCY DEPARTMENT ?Provider Note ? ? ?CSN: [id number] ?Arrival date & time: 03/04/22  1138 ? ?  ? ?History ? ?Chief Complaint  ?Patient presents with  ? Near Syncope  ? ? ?Thomas Holt is a 51 y.o. male. ? ?The history is provided by the patient and medical records.  ?Near Syncope ? ?51 year old male with no significant past medical history presenting to the ED for feeling generally weak earlier today.  States after he stood up he felt like he may pass out but never lost consciousness.  States this tends to happen from time to time.  He admits to intentionally losing over 100 pounds over the past few years.  He states he has difficulty adhering to regimen of eating small meals throughout the day, often tends to go several hours without eating.  Today when feeling weak he realized he had not eaten in 8+ hours.  He ate about 2 hours ago while waiting in the lobby and reports now he is feeling better.  He has been up and ambulatory to the restroom without any feelings of near syncope.  He denies any abdominal pain, chest pain, or shortness of breath.  He is a Consulting civil engineer at [employer] and had blood work done last week for same which he reports was normal. ? ?Home Medications ?Prior to Admission medications   ?Medication Sig Start Date End Date Taking? Authorizing Provider  ?amoxicillin-clavulanate (AUGMENTIN) 875-125 MG tablet Take 1 tablet by mouth 2 (two) times daily. 01/21/22   Storm Frisk, MD  ?fluticasone (FLONASE) 50 MCG/ACT nasal spray Place 2 sprays into both nostrils daily. 01/21/22   Storm Frisk, MD  ?   ? ?Allergies    ?Patient has no known allergies.   ? ?Review of Systems   ?Review of Systems  ?Cardiovascular:  Positive for near-syncope.  ?All other systems reviewed and are negative. ? ?Physical Exam ?Updated Vital Signs ?BP 118/67 (BP Location: Left Arm)   Pulse 64   Temp 97.8 ?F (36.6 ?C)   Resp 18   SpO2 98%  ? ?Physical Exam ?Vitals and nursing note reviewed.   ?Constitutional:   ?   Appearance: He is well-developed.  ?HENT:  ?   Head: Normocephalic and atraumatic.  ?Eyes:  ?   Conjunctiva/sclera: Conjunctivae normal.  ?   Pupils: Pupils are equal, round, and reactive to light.  ?Cardiovascular:  ?   Rate and Rhythm: Normal rate and regular rhythm.  ?   Heart sounds: Normal heart sounds.  ?Pulmonary:  ?   Effort: Pulmonary effort is normal.  ?   Breath sounds: Normal breath sounds.  ?Abdominal:  ?   General: Bowel sounds are normal.  ?   Palpations: Abdomen is soft.  ?Musculoskeletal:     ?   General: Normal range of motion.  ?   Cervical back: Normal range of motion.  ?Skin: ?   General: Skin is warm and dry.  ?Neurological:  ?   Mental Status: He is alert and oriented to person, place, and time.  ?   Comments: AAOx3, answering questions and following commands appropriately; equal strength UE and LE bilaterally; CN grossly intact; moves all extremities appropriately without ataxia; no focal neuro deficits or facial asymmetry appreciated  ? ? ?ED Results / Procedures / Treatments   ?Labs ?(all labs ordered are listed, but only abnormal results are displayed) ?Labs Reviewed  ?BASIC METABOLIC PANEL - Abnormal; Notable for the following components:  ?  Result Value  ? Glucose, Bld 231 (*)   ? All other components within normal limits  ?URINALYSIS, ROUTINE W REFLEX MICROSCOPIC - Abnormal; Notable for the following components:  ? Ketones, ur 20 (*)   ? All other components within normal limits  ?CBG MONITORING, ED - Abnormal; Notable for the following components:  ? Glucose-Capillary 217 (*)   ? All other components within normal limits  ?CBC  ? ? ?EKG ?EKG Interpretation ? ?Date/Time:  Wednesday March 04 2022 13:39:38 EDT ?Ventricular Rate:  87 ?PR Interval:  144 ?QRS Duration: 84 ?QT Interval:  350 ?QTC Calculation: 421 ?R Axis:   51 ?Text Interpretation: Normal sinus rhythm Nonspecific ST and T wave abnormality Abnormal ECG When compared with ECG of 17-May-2013 12:32,  Since last tracing rate slower Confirmed by Linwood Dibbles [phone]) on 03/04/2022 10:01:10 PM ? ?Radiology ?No results found. ? ?Procedures ?Procedures  ? ? ?Medications Ordered in ED ?Medications - No data to display ? ?ED Course/ Medical Decision Making/ A&P ?  ?                        ?Medical Decision Making ? ?51 y.o. M here with intermittent generalized weakness and near syncope.  States this tends to happen if he goes too long without eating-- did not eat for about 8+ hours today.  He did eat 2 hours ago while in the waiting room and now is feeling much better now.  He has been up and walking around in the ED without difficulty since. ? ?He is AAOx3, ambulatory without issue.  VSS.  EKG without acute ischemic changes.  Labs today are overall reassuring-- no anemia, mild hyperglycemia without findings of DKA.  UA with ketones, possibly some mild dehydration.  He denies any chest pain, shortness of breath, headaches, or other concerning symptoms.  As he is feeling back to baseline, feel he is stable for discharge.  We have discussed eating small meals frequently throughout the day.  He can follow-up with PCP.  Return here for any new or acute changes. ? ?Final Clinical Impression(s) / ED Diagnoses ?Final diagnoses:  ?Near syncope  ? ? ?Rx / DC Orders ?ED Discharge Orders   ? ? None  ? ?  ? ? ?  ?Garlon Hatchet, PA-C ?03/04/22 2305 ? ?  ?Linwood Dibbles, MD ?03/05/22 [phone] ? ?

## 2022-03-04 NOTE — ED Triage Notes (Signed)
Pt reports occasionally feeling weak and near syncopal when standing up. This has been occurring for extended time. Denies any specific pain.  ?

## 2022-03-04 NOTE — ED Notes (Signed)
All discharge instructions reviewed with patient and patient verbalized understanding of same. Patient stable and ambulatory at time of discharge.  ?

## 2022-03-11 ENCOUNTER — Encounter: Payer: Self-pay | Admitting: Critical Care Medicine

## 2022-03-11 ENCOUNTER — Ambulatory Visit: Payer: 59 | Attending: Critical Care Medicine | Admitting: Critical Care Medicine

## 2022-03-11 VITALS — BP 118/77 | HR 80 | Ht 71.0 in | Wt 190.0 lb

## 2022-03-11 DIAGNOSIS — Z6826 Body mass index (BMI) 26.0-26.9, adult: Secondary | ICD-10-CM

## 2022-03-11 DIAGNOSIS — Z114 Encounter for screening for human immunodeficiency virus [HIV]: Secondary | ICD-10-CM

## 2022-03-11 DIAGNOSIS — Z1159 Encounter for screening for other viral diseases: Secondary | ICD-10-CM

## 2022-03-11 DIAGNOSIS — Z1211 Encounter for screening for malignant neoplasm of colon: Secondary | ICD-10-CM | POA: Diagnosis not present

## 2022-03-11 DIAGNOSIS — R739 Hyperglycemia, unspecified: Secondary | ICD-10-CM | POA: Diagnosis not present

## 2022-03-11 DIAGNOSIS — K4090 Unilateral inguinal hernia, without obstruction or gangrene, not specified as recurrent: Secondary | ICD-10-CM | POA: Insufficient documentation

## 2022-03-11 HISTORY — DX: Hyperglycemia, unspecified: R73.9

## 2022-03-11 MED ORDER — GLUCOSE BLOOD VI STRP: ORAL_STRIP | 12 refills | Status: DC

## 2022-03-11 MED ORDER — ONETOUCH VERIO W/DEVICE KIT
PACK | 0 refills | Status: DC
Start: 2022-03-11 — End: 2022-06-18

## 2022-03-11 MED ORDER — LANCETS MISC: 2 refills | Status: DC

## 2022-03-11 NOTE — Progress Notes (Signed)
? ?New Patient Office Visit ? ?Subjective   ? ?Patient ID: Thomas Holt, male    DOB: June 18, 1971  Age: 51 y.o. MRN: [id number] ? ?CC:  ?Chief Complaint  ?Patient presents with  ? Follow-up  ? ? 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  In addition he also does some part-time work at [employer].  [facility] and had a series of labs drawn including renal panel liver panel blood counts urine study and PSA.  He will get these reports to me after he signs patient information release form.  They also suggest that he needs a colonoscopy. ? ?We had seen the patient in the [facility] as a screening study exam.  At that visit he had complaints of right inguinal swelling.  He had a right inguinal hernia and we have already made a referral for him to general surgery and they have already seen the patient.  He is trying to save money for the co-pay he will need to have a open inguinal hernia repair with mesh placement with Dr. Brantley Stage. ? ?Another issue is that he had an episode of presyncope with dizziness and fatigue and went to the emergency room a week ago.  They found he had ketones in the urine volume depletion blood sugar of 230 they gave him fluids and sent him home.  He has a history of significant obesity in the past and that he weighed over 250 pounds and over 2 years lost a significant amount of weight.  He comes in today to 190 pounds BMI of 26-1/2.  He does state he has some polyuria and polyphagia.  He does not have polydipsia.  He states his dizziness has improved somewhat.  He has been trying to eat more healthy food options.  He said at 1 point he was just eating too many bananas. ? ?The patient does not have any other complaints at this time.  His mother did have diabetes. ? ?Patient did have an open emergent appendectomy when he was 51 years of age at [facility] as his only surgical history ?Only other complaint he has is about a mild upper quadrant tenderness in the abdomen at times ?The patient does not drink and does not use any risky substances ?Outpatient Encounter Medications as of 03/11/2022  ?Medication Sig  ? Blood Glucose Monitoring Suppl (ONETOUCH VERIO) w/Device KIT Check blood sugar daily  ? glucose blood test strip Use as instructed  ? Lancets MISC Use to check blood sugar daily  ? [DISCONTINUED] amoxicillin-clavulanate (AUGMENTIN) 875-125 MG tablet Take 1 tablet by mouth 2 (two) times daily.  ? [DISCONTINUED] fluticasone (FLONASE) 50 MCG/ACT nasal spray Place 2 sprays into both nostrils daily.  ? ?No facility-administered encounter medications on file as of 03/11/2022.  ? ? ?History reviewed. No pertinent past medical history. ? ?Past Surgical History:  ?Procedure Laterality Date  ? APPENDECTOMY    ? ? ?Family History  ?Problem Relation Age of Onset  ? Cancer Mother   ? Heart disease Father   ? ? ?Social History  ? ?Socioeconomic History  ? Marital status: Divorced  ?  Spouse name: Not on file  ? Number of children: 1  ? Years of education: Not on file  ? Highest education level: Not on file  ?Occupational History  ? Occupation: Chief of Staff  ?  Comment: [employer]  ?Tobacco Use  ? Smoking status: Never  ? Smokeless tobacco: Not on file  ?Vaping Use  ? Vaping Use: Not on file  ?Substance and Sexual Activity  ? Alcohol use: No  ? Drug use: No  ? Sexual activity: Not Currently  ?  Birth control/protection: Condom  ?Other Topics Concern  ? Not on file  ?Social History Narrative  ? Not on file  ? ?Social Determinants of Health  ? ?Financial Resource Strain: Not on file  ?Food Insecurity: Not on file  ?Transportation Needs: Not on file  ?Physical Activity: Not on file  ?Stress: Not on file  ?Social Connections: Not on file  ?Intimate Partner Violence: Not on file  ? ? ?Review of Systems  ?Constitutional:  Negative for chills, diaphoresis, fever,  malaise/fatigue and weight loss.  ?HENT:  Negative for congestion, hearing loss, nosebleeds, sore throat and tinnitus.   ?Eyes: Negative.  Negative for blurred vision, photophobia and redness.  ?Respiratory: Negative.  Negative for cough, hemoptysis, sputum production, shortness of breath, wheezing and stridor.   ?Cardiovascular: Negative.  Negative for chest pain, palpitations, orthopnea, claudication, leg swelling and PND.  ?Gastrointestinal:  Positive for abdominal pain. Negative for blood in stool, constipation, diarrhea, heartburn, melena, nausea and vomiting.  ?     Polyphagia ? ?  ?Genitourinary:  Negative for dysuria, flank pain, frequency, hematuria and urgency.  ?     Nocturia polyuria  ?Musculoskeletal:  Negative for back pain, falls, joint pain, myalgias and neck pain.  ?Skin:  Negative for itching and rash.  ?Neurological:  Negative for dizziness, tingling, tremors, sensory change, speech change, focal weakness, seizures, loss of consciousness, weakness and headaches.  ?Endo/Heme/Allergies:  Negative for environmental allergies and polydipsia. Does not bruise/bleed easily.  ?Psychiatric/Behavioral:  Negative for depression, memory loss, substance abuse and suicidal ideas. The patient is not nervous/anxious and does not have insomnia.   ? ?  ? ? ?Objective   ? ?BP 118/77   Pulse 80   Ht '5\' 11"'  (1.803 m)   Wt 190 lb (86.2 kg)   SpO2 95%   BMI 26.50 kg/m?  ? ?Physical Exam ?Vitals reviewed.  ?Constitutional:   ?   Appearance: Normal appearance. He is well-developed. He is not diaphoretic.  ?HENT:  ?   Head: Normocephalic and atraumatic.  ?   Right Ear: Tympanic membrane normal.  ?   Left Ear: Tympanic membrane normal.  ?   Nose: Nose normal. No nasal deformity, septal deviation, mucosal edema or rhinorrhea.  ?   Right Sinus: No maxillary sinus tenderness or frontal sinus tenderness.  ?   Left Sinus: No maxillary sinus tenderness or frontal sinus tenderness.  ?   Mouth/Throat:  ?   Mouth: Mucous  membranes are moist.  ?   Pharynx: Oropharynx is clear. No oropharyngeal exudate or posterior oropharyngeal erythema.  ?Eyes:  ?   General: No scleral icterus. ?   Conjunctiva/sclera: Conjunctivae normal.  ?   Pupils: Pupils are equal, round, and reactive to light.  ?Neck:  ?   Thyroid: No thyromegaly.  ?   Vascular: No carotid bruit or JVD.  ?   Trachea: Trachea normal. No tracheal tenderness or tracheal deviation.  ?Cardiovascular:  ?   Rate and Rhythm: Normal rate and regular rhythm.  ?   Chest Wall: PMI is not displaced.  ?   Pulses: Normal pulses. No decreased pulses.  ?   Heart sounds: Normal heart sounds, S1 normal and S2 normal.  Heart sounds not distant. No murmur heard. ?No systolic murmur is present.  ?No diastolic murmur is present.  ?  No friction rub. No gallop. No S3 or S4 sounds.  ?Pulmonary:  ?   Effort: No tachypnea, accessory muscle usage or respiratory distress.  ?   Breath sounds: No stridor. No decreased breath sounds, wheezing, rhonchi or rales.  ?Chest:  ?   Chest wall: No tenderness.  ?Abdominal:  ?   General: Bowel sounds are normal. There is no distension.  ?   Palpations: Abdomen is soft. Abdomen is not rigid.  ?   Tenderness: There is no abdominal tenderness. There is no guarding or rebound.  ?   Hernia: A hernia is present.  ?   Comments: Old healed scar right upper quadrant ?Abdomen is nontender and liver is not enlarged ?Right inguinal hernia is reducible nontender no gangrene  ?Musculoskeletal:     ?   General: Normal range of motion.  ?   Cervical back: Normal range of motion and neck supple. No edema, erythema or rigidity. No muscular tenderness. Normal range of motion.  ?Lymphadenopathy:  ?   Head:  ?   Right side of head: No submental or submandibular adenopathy.  ?   Left side of head: No submental or submandibular adenopathy.  ?   Cervical: No cervical adenopathy.  ?Skin: ?   General: Skin is warm and dry.  ?   Coloration: Skin is not pale.  ?   Findings: No rash.  ?   Nails:  There is no clubbing.  ?Neurological:  ?   General: No focal deficit present.  ?   Mental Status: He is alert and oriented to person, place, and time.  ?   Sensory: No sensory deficit.  ?Psychiatric:     ?   Mood a

## 2022-03-11 NOTE — Patient Instructions (Signed)
Follow lifestyle medicine diet per handout given and per our discussion ? ?A glucose meter was sent to the [facility] pick that up and check your blood sugar once daily ? ?Labs today include metabolic panel hemoglobin A1c HIV hepatitis C ? ?Referral to gastroenterology was made for colonoscopy ? ?Keep your follow-up with Dr. Brantley Stage for your hernia surgery ? ?No medications at this time are indicated ? ?We will have you sign a record release so we get records from [place] [employer] [facility] ? ?Return to see Dr. Joya Gaskins 3 months ? ? ?

## 2022-03-11 NOTE — Assessment & Plan Note (Signed)
Patient's had remarkable weight loss at this point I gave him a lifestyle medicine handout to make adjustments in his diet and exercise program ?

## 2022-03-11 NOTE — Assessment & Plan Note (Addendum)
History of hyperglycemia in the emergency room with mild ketones in the urine and volume depletion.  I suspect he may have diabetes.  The patient has had issues of significant obesity in the past he has had significant weight loss.  He needs to follow a healthier diet. ? ?Plan is to check a hemoglobin A1c metabolic panel at this visit ? ?I went over with the patient a lifestyle medicine handout including food choices that are more plant-based and increasing his exercise program ? ?We may need to start metformin but lets wait on the A1c to return ? ?Would also like to get labs he obtained at the [facility] he will sign a record release for this ? ?I have also issued the patient a glucometer for him to check his blood sugars fasting each morning he will bring these results next visit ?

## 2022-03-11 NOTE — Assessment & Plan Note (Signed)
Patient has a right inguinal hernia he can be cleared for planned surgery follow-up on the A1c results ? ? ?

## 2022-03-11 NOTE — Assessment & Plan Note (Signed)
Referral to GI was made for colon cancer screening ?

## 2022-03-12 ENCOUNTER — Telehealth: Payer: Self-pay

## 2022-03-12 LAB — BASIC METABOLIC PANEL
BUN/Creatinine Ratio: 15 (ref 9–20)
BUN: 15 mg/dL (ref 6–24)
CO2: 22 mmol/L (ref 20–29)
Calcium: 9.5 mg/dL (ref 8.7–10.2)
Chloride: 104 mmol/L (ref 96–106)
Creatinine, Ser: 1 mg/dL (ref 0.76–1.27)
Glucose: 92 mg/dL (ref 70–99)
Potassium: 4.2 mmol/L (ref 3.5–5.2)
Sodium: 141 mmol/L (ref 134–144)
eGFR: 92 mL/min/{1.73_m2} (ref >59)

## 2022-03-12 LAB — HEMOGLOBIN A1C
Est. average glucose Bld gHb Est-mCnc: 94 mg/dL
Hgb A1c MFr Bld: 4.9 % (ref 4.8–5.6)

## 2022-03-12 LAB — HCV AB W REFLEX TO QUANT PCR: HCV Ab: NONREACTIVE

## 2022-03-12 LAB — HCV INTERPRETATION

## 2022-03-12 LAB — HIV ANTIBODY (ROUTINE TESTING W REFLEX): HIV Screen 4th Generation wRfx: NONREACTIVE

## 2022-03-12 NOTE — Telephone Encounter (Signed)
Pt was called and is aware of results, DOB was confirmed.  ?

## 2022-03-12 NOTE — Telephone Encounter (Signed)
-----   Message from Storm Frisk, MD sent at 03/12/2022  6:18 AM EDT ----- ?Let pt know good news: no diabetes, hiv hep c neg, waiting on labs from [facility] [employer] [facility] ?

## 2022-03-15 ENCOUNTER — Emergency Department (HOSPITAL_COMMUNITY)
Admission: EM | Admit: 2022-03-15 | Discharge: 2022-03-15 | Disposition: A | Discharge status: Home / Self Care | Payer: 59 | Attending: Emergency Medicine | Admitting: Emergency Medicine

## 2022-03-15 ENCOUNTER — Emergency Department (HOSPITAL_COMMUNITY): Payer: 59

## 2022-03-15 DIAGNOSIS — R1084 Generalized abdominal pain: Secondary | ICD-10-CM | POA: Diagnosis present

## 2022-03-15 DIAGNOSIS — K4090 Unilateral inguinal hernia, without obstruction or gangrene, not specified as recurrent: Secondary | ICD-10-CM | POA: Diagnosis not present

## 2022-03-15 LAB — CBC WITH DIFFERENTIAL/PLATELET
Abs Immature Granulocytes: 0.06 10*3/uL (ref 0.00–0.07)
Basophils Absolute: 0 10*3/uL (ref 0.0–0.1)
Basophils Relative: 0 %
Eosinophils Absolute: 0 10*3/uL (ref 0.0–0.5)
Eosinophils Relative: 0 %
HCT: 44 % (ref 39.0–52.0)
Hemoglobin: 14.4 g/dL (ref 13.0–17.0)
Immature Granulocytes: 0 %
Lymphocytes Relative: 17 %
Lymphs Abs: 2.2 10*3/uL (ref 0.7–4.0)
MCH: 30.4 pg (ref 26.0–34.0)
MCHC: 32.7 g/dL (ref 30.0–36.0)
MCV: 92.8 fL (ref 80.0–100.0)
Monocytes Absolute: 0.7 10*3/uL (ref 0.1–1.0)
Monocytes Relative: 5 %
Neutro Abs: 10.5 10*3/uL — ABNORMAL HIGH (ref 1.7–7.7)
Neutrophils Relative %: 78 %
Platelets: 171 10*3/uL (ref 150–400)
RBC: 4.74 MIL/uL (ref 4.22–5.81)
RDW: 11.5 % (ref 11.5–15.5)
WBC: 13.5 10*3/uL — ABNORMAL HIGH (ref 4.0–10.5)
nRBC: 0 % (ref 0.0–0.2)

## 2022-03-15 LAB — COMPREHENSIVE METABOLIC PANEL
ALT: 17 U/L (ref 0–44)
AST: 22 U/L (ref 15–41)
Albumin: 3.9 g/dL (ref 3.5–5.0)
Alkaline Phosphatase: 60 U/L (ref 38–126)
Anion gap: 9 (ref 5–15)
BUN: 20 mg/dL (ref 6–20)
CO2: 25 mmol/L (ref 22–32)
Calcium: 9.1 mg/dL (ref 8.9–10.3)
Chloride: 104 mmol/L (ref 98–111)
Creatinine, Ser: 1.08 mg/dL (ref 0.61–1.24)
GFR, Estimated: >60 mL/min (ref >60)
Glucose, Bld: 99 mg/dL (ref 70–99)
Potassium: 3.8 mmol/L (ref 3.5–5.1)
Sodium: 138 mmol/L (ref 135–145)
Total Bilirubin: 1.2 mg/dL (ref 0.3–1.2)
Total Protein: 7.6 g/dL (ref 6.5–8.1)

## 2022-03-15 LAB — LIPASE, BLOOD: Lipase: 33 U/L (ref 11–51)

## 2022-03-15 LAB — LACTIC ACID, PLASMA: Lactic Acid, Venous: 1 mmol/L (ref 0.5–1.9)

## 2022-03-15 MED ORDER — ONDANSETRON HCL 4 MG/2ML IJ SOLN
4.0000 mg | Freq: Once | INTRAMUSCULAR | Status: AC
  Administered 2022-03-15: 4 mg via INTRAVENOUS
  Filled 2022-03-15: qty 2

## 2022-03-15 MED ORDER — HYDROMORPHONE HCL 1 MG/ML IJ SOLN
1.0000 mg | Freq: Once | INTRAMUSCULAR | Status: DC
Start: 2022-03-15 — End: 2022-03-15
  Filled 2022-03-15: qty 1

## 2022-03-15 MED ORDER — SODIUM CHLORIDE 0.9 % IV BOLUS
500.0000 mL | Freq: Once | INTRAVENOUS | Status: AC
  Administered 2022-03-15: 500 mL via INTRAVENOUS

## 2022-03-15 MED ORDER — IOHEXOL 300 MG/ML  SOLN
100.0000 mL | Freq: Once | INTRAMUSCULAR | Status: AC | PRN
  Administered 2022-03-15: 100 mL via INTRAVENOUS

## 2022-03-15 NOTE — ED Provider Notes (Signed)
?[facility] ?Provider Note ? ? ?CSN: [id number] ?Arrival date & time: 03/15/22  6213 ? ?  ? ?History ? ?Chief Complaint  ?Patient presents with  ? Abdominal Pain  ?  Reports abdominal discomfort that started around 8pm. Denies n/v/d  ? ? ?Thomas Holt is a 51 y.o. male. ? ?Patient presents to the emergency department for evaluation of abdominal pain.  Patient reports that he has noticed diffuse abdominal pain but has focal pain in the right lower inguinal area where he has a known hernia.  No vomiting, fever. ? ? ?  ? ?Home Medications ?Prior to Admission medications   ?Medication Sig Start Date End Date Taking? Authorizing Provider  ?Blood Glucose Monitoring Suppl (ONETOUCH VERIO) w/Device KIT Check blood sugar daily 03/11/22   Elsie Stain, MD  ?glucose blood test strip Use as instructed 03/11/22   Elsie Stain, MD  ?Lancets MISC Use to check blood sugar daily 03/11/22   Elsie Stain, MD  ?   ? ?Allergies    ?Patient has no known allergies.   ? ?Review of Systems   ?Review of Systems  ?Gastrointestinal:  Positive for abdominal pain.  ? ?Physical Exam ?Updated Vital Signs ?BP 121/63   Pulse 70   Temp 97.8 ?F (36.6 ?C) (Oral)   Resp 18   Ht _0  (1.803 m)   Wt 86.2 kg   SpO2 99%   BMI 26.50 kg/m?  ?Physical Exam ?Vitals and nursing note reviewed.  ?Constitutional:   ?   General: He is not in acute distress. ?   Appearance: He is well-developed.  ?HENT:  ?   Head: Normocephalic and atraumatic.  ?   Mouth/Throat:  ?   Mouth: Mucous membranes are moist.  ?Eyes:  ?   General: Vision grossly intact. Gaze aligned appropriately.  ?   Extraocular Movements: Extraocular movements intact.  ?   Conjunctiva/sclera: Conjunctivae normal.  ?Cardiovascular:  ?   Rate and Rhythm: Normal rate and regular rhythm.  ?   Pulses: Normal pulses.  ?   Heart sounds: Normal heart sounds, S1 normal and S2 normal. No murmur heard. ?  No friction rub. No gallop.  ?Pulmonary:  ?    Effort: Pulmonary effort is normal. No respiratory distress.  ?   Breath sounds: Normal breath sounds.  ?Abdominal:  ?   Palpations: Abdomen is soft.  ?   Tenderness: There is no abdominal tenderness. There is no guarding or rebound.  ?   Hernia: A hernia is present. Hernia is present in the right inguinal area.  ?Musculoskeletal:     ?   General: No swelling.  ?   Cervical back: Full passive range of motion without pain, normal range of motion and neck supple. No pain with movement, spinous process tenderness or muscular tenderness. Normal range of motion.  ?   Right lower leg: No edema.  ?   Left lower leg: No edema.  ?Skin: ?   General: Skin is warm and dry.  ?   Capillary Refill: Capillary refill takes less than 2 seconds.  ?   Findings: No ecchymosis, erythema, lesion or wound.  ?Neurological:  ?   Mental Status: He is alert and oriented to person, place, and time.  ?   GCS: GCS eye subscore is 4. GCS verbal subscore is 5. GCS motor subscore is 6.  ?   Cranial Nerves: Cranial nerves 2-12 are intact.  ?   Sensory: Sensation is intact.  ?  Motor: Motor function is intact. No weakness or abnormal muscle tone.  ?   Coordination: Coordination is intact.  ?Psychiatric:     ?   Mood and Affect: Mood normal.     ?   Speech: Speech normal.     ?   Behavior: Behavior normal.  ? ? ?ED Results / Procedures / Treatments   ?Labs ?(all labs ordered are listed, but only abnormal results are displayed) ?Labs Reviewed  ?CBC WITH DIFFERENTIAL/PLATELET - Abnormal; Notable for the following components:  ?    Result Value  ? WBC 13.5 (*)   ? Neutro Abs 10.5 (*)   ? All other components within normal limits  ?COMPREHENSIVE METABOLIC PANEL  ?LACTIC ACID, PLASMA  ?LIPASE, BLOOD  ? ? ?EKG ?None ? ?Radiology ?CT ABDOMEN PELVIS W CONTRAST ? ?Result Date: 03/15/2022 ?CLINICAL DATA:  Right lower quadrant pain EXAM: CT ABDOMEN AND PELVIS WITH CONTRAST TECHNIQUE: Multidetector CT imaging of the abdomen and pelvis was performed using the  standard protocol following bolus administration of intravenous contrast. RADIATION DOSE REDUCTION: This exam was performed according to the departmental dose-optimization program which includes automated exposure control, adjustment of the mA and/or kV according to patient size and/or use of iterative reconstruction technique. CONTRAST:  175m OMNIPAQUE IOHEXOL 300 MG/ML  SOLN COMPARISON:  None. FINDINGS: Lower chest: No acute abnormality Hepatobiliary: No focal liver abnormality is seen. Status post cholecystectomy. No biliary dilatation. Pancreas: No focal abnormality or ductal dilatation. Spleen: No focal abnormality.  Normal size. Adrenals/Urinary Tract: No adrenal abnormality. No focal renal abnormality. No stones or hydronephrosis. Urinary bladder is unremarkable. Stomach/Bowel: Prominent small bowel loops are noted centrally containing fluid and some feculent material material. This is nonspecific and could reflect focal ileus four partial small bowel obstruction. Distal small bowel decompressed. Moderate stool throughout the colon. Appendix not visualized. Vascular/Lymphatic: No evidence of aneurysm or adenopathy. Reproductive: No visible focal abnormality. Other: No free fluid or free air. Moderate-sized right inguinal hernia containing fat and fluid. Small umbilical hernia containing fat. Musculoskeletal: No acute bony abnormality. IMPRESSION: Prominent central small bowel loops containing fluid and feculent material. This could reflect focal ileus or partial small bowel obstruction. Moderate sized right inguinal hernia containing fat and fluid. Small umbilical hernia containing fat. Electronically Signed   By: KRolm BaptiseM.D.   On: 03/15/2022 03:08   ? ?Procedures ?Procedures  ? ? ?Medications Ordered in ED ?Medications  ?HYDROmorphone (DILAUDID) injection 1 mg (1 mg Intravenous Patient Refused/Not Given 03/15/22 0121)  ?sodium chloride 0.9 % bolus 500 mL (0 mLs Intravenous Stopped 03/15/22 0154)   ?ondansetron ([facility] injection 4 mg (4 mg Intravenous Given 03/15/22 0120)  ?iohexol (OMNIPAQUE) 300 MG/ML solution 100 mL (100 mLs Intravenous Contrast Given 03/15/22 0300)  ? ? ?ED Course/ Medical Decision Making/ A&P ?  ?                        ?Medical Decision Making ?Amount and/or Complexity of Data Reviewed ?Labs: ordered. ?Radiology: ordered. ? ?Risk ?Prescription drug management. ? ? ?Patient presents to the emergency department for evaluation of abdominal pain.  He is concerned about his right inguinal hernia.  He reports that he has seen a sPsychologist, sport and exercisebut he does not have insurance and has not been able to have surgery yet. ? ?Patient's vital signs are normal.  No fever.  Blood work was unremarkable.  Patient underwent CT scan to further evaluate.  Inguinal hernia that is palpated is fat-containing,  no bowel.  CT with ileus versus partial small bowel obstruction.  He has not had any nausea or vomiting here in the emergency department.  He has bowel sounds present on exam.  Doubt small bowel obstruction, will discharge with return precautions. ? ? ? ? ? ? ? ?Final Clinical Impression(s) / ED Diagnoses ?Final diagnoses:  ?Generalized abdominal pain  ?Unilateral inguinal hernia without obstruction or gangrene, recurrence not specified  ? ? ?Rx / DC Orders ?ED Discharge Orders   ? ? None  ? ?  ? ? ?  ?Orpah Greek, MD ?03/15/22 [phone] ? ?

## 2022-03-15 NOTE — Discharge Instructions (Addendum)
Return to this emergency department if you develop fever, vomiting or worsening swelling or pain in the area of your hernia. ?

## 2022-05-03 ENCOUNTER — Encounter (HOSPITAL_COMMUNITY): Payer: Self-pay | Admitting: Emergency Medicine

## 2022-05-03 ENCOUNTER — Other Ambulatory Visit: Payer: Self-pay

## 2022-05-03 ENCOUNTER — Emergency Department (HOSPITAL_COMMUNITY)
Admission: EM | Admit: 2022-05-03 | Discharge: 2022-05-03 | Disposition: A | Discharge status: Home / Self Care | Payer: 59 | Attending: Emergency Medicine | Admitting: Emergency Medicine

## 2022-05-03 ENCOUNTER — Emergency Department (HOSPITAL_COMMUNITY): Payer: 59

## 2022-05-03 DIAGNOSIS — K4020 Bilateral inguinal hernia, without obstruction or gangrene, not specified as recurrent: Secondary | ICD-10-CM | POA: Insufficient documentation

## 2022-05-03 DIAGNOSIS — R1031 Right lower quadrant pain: Secondary | ICD-10-CM | POA: Diagnosis present

## 2022-05-03 DIAGNOSIS — K420 Umbilical hernia with obstruction, without gangrene: Secondary | ICD-10-CM | POA: Diagnosis not present

## 2022-05-03 DIAGNOSIS — K4090 Unilateral inguinal hernia, without obstruction or gangrene, not specified as recurrent: Secondary | ICD-10-CM

## 2022-05-03 LAB — URINALYSIS, ROUTINE W REFLEX MICROSCOPIC
Bilirubin Urine: NEGATIVE
Glucose, UA: NEGATIVE mg/dL
Hgb urine dipstick: NEGATIVE
Ketones, ur: 20 mg/dL — AB
Leukocytes,Ua: NEGATIVE
Nitrite: NEGATIVE
Protein, ur: NEGATIVE mg/dL
Specific Gravity, Urine: >1.046 — ABNORMAL HIGH (ref 1.005–1.030)
pH: 5 (ref 5.0–8.0)

## 2022-05-03 LAB — CBC WITH DIFFERENTIAL/PLATELET
Abs Immature Granulocytes: 0.04 10*3/uL (ref 0.00–0.07)
Basophils Absolute: 0 10*3/uL (ref 0.0–0.1)
Basophils Relative: 0 %
Eosinophils Absolute: 0 10*3/uL (ref 0.0–0.5)
Eosinophils Relative: 0 %
HCT: 47.4 % (ref 39.0–52.0)
Hemoglobin: 15.1 g/dL (ref 13.0–17.0)
Immature Granulocytes: 0 %
Lymphocytes Relative: 24 %
Lymphs Abs: 2.4 10*3/uL (ref 0.7–4.0)
MCH: 29.5 pg (ref 26.0–34.0)
MCHC: 31.9 g/dL (ref 30.0–36.0)
MCV: 92.6 fL (ref 80.0–100.0)
Monocytes Absolute: 0.7 10*3/uL (ref 0.1–1.0)
Monocytes Relative: 7 %
Neutro Abs: 6.9 10*3/uL (ref 1.7–7.7)
Neutrophils Relative %: 69 %
Platelets: 193 10*3/uL (ref 150–400)
RBC: 5.12 MIL/uL (ref 4.22–5.81)
RDW: 11.6 % (ref 11.5–15.5)
WBC: 10 10*3/uL (ref 4.0–10.5)
nRBC: 0 % (ref 0.0–0.2)

## 2022-05-03 LAB — COMPREHENSIVE METABOLIC PANEL
ALT: 15 U/L (ref 0–44)
AST: 20 U/L (ref 15–41)
Albumin: 3.9 g/dL (ref 3.5–5.0)
Alkaline Phosphatase: 57 U/L (ref 38–126)
Anion gap: 7 (ref 5–15)
BUN: 16 mg/dL (ref 6–20)
CO2: 27 mmol/L (ref 22–32)
Calcium: 9.3 mg/dL (ref 8.9–10.3)
Chloride: 108 mmol/L (ref 98–111)
Creatinine, Ser: 1.05 mg/dL (ref 0.61–1.24)
GFR, Estimated: >60 mL/min (ref >60)
Glucose, Bld: 100 mg/dL — ABNORMAL HIGH (ref 70–99)
Potassium: 3.8 mmol/L (ref 3.5–5.1)
Sodium: 142 mmol/L (ref 135–145)
Total Bilirubin: 1.3 mg/dL — ABNORMAL HIGH (ref 0.3–1.2)
Total Protein: 7.5 g/dL (ref 6.5–8.1)

## 2022-05-03 MED ORDER — ONDANSETRON HCL 4 MG/2ML IJ SOLN
4.0000 mg | Freq: Once | INTRAMUSCULAR | Status: AC
  Administered 2022-05-03: 4 mg via INTRAVENOUS
  Filled 2022-05-03: qty 2

## 2022-05-03 MED ORDER — IOHEXOL 300 MG/ML  SOLN
100.0000 mL | Freq: Once | INTRAMUSCULAR | Status: AC | PRN
  Administered 2022-05-03: 100 mL via INTRAVENOUS

## 2022-05-03 MED ORDER — SODIUM CHLORIDE (PF) 0.9 % IJ SOLN
INTRAMUSCULAR | Status: AC
  Filled 2022-05-03: qty 50

## 2022-05-03 MED ORDER — HYDROMORPHONE HCL 1 MG/ML IJ SOLN
1.0000 mg | Freq: Once | INTRAMUSCULAR | Status: DC
Start: 2022-05-03 — End: 2022-05-03
  Filled 2022-05-03: qty 1

## 2022-05-03 MED ORDER — MORPHINE SULFATE (PF) 4 MG/ML IV SOLN
4.0000 mg | Freq: Once | INTRAVENOUS | Status: AC
  Administered 2022-05-03: 4 mg via INTRAVENOUS
  Filled 2022-05-03: qty 1

## 2022-05-03 NOTE — ED Triage Notes (Signed)
  Patient comes in with R sided inguinal hernia.  Patient has had hernia since last year and was supposed to have surgery in March but was not able to set it up.  Patient states it got bigger last night and started hurting around 2200.  No heavy lifting or straining per patient.  No N/V.  Pain 5/10, discomfort.

## 2022-05-03 NOTE — ED Provider Notes (Signed)
[facility] DEPT Provider Note   CSN: [id number] Arrival date & time: 05/03/22  0543     History  Chief Complaint  Patient presents with   Abdominal Pain    Thomas Holt is a 51 y.o. male with medical history of right inguinal hernia.  Patient presents to the ED for evaluation of right inguinal hernia.  Patient states that this hernia has been present for the last 1 year and he has been seen by general surgery.  General surgery advised this patient to have the hernia repaired however due to financial reasons he has been unable to do so.  Patient states that for the last 1 day, his hernia pain is increased as well is the size of the hernia.  The patient denies any recent heavy lifting.  Patient reports that he is stooling and urinating normally. Patient denies any recent fevers, constipation, issues with urination.  On examination, the patient states that his pain has subsided.   Abdominal Pain Associated symptoms: no constipation, no diarrhea, no dysuria, no fever, no nausea and no vomiting        Home Medications Prior to Admission medications   Medication Sig Start Date End Date Taking? Authorizing Provider  Blood Glucose Monitoring Suppl [facility] VERIO) w/Device KIT Check blood sugar daily 03/11/22   Elsie Stain, MD  glucose blood test strip Use as instructed 03/11/22   Elsie Stain, MD  Lancets MISC Use to check blood sugar daily 03/11/22   Elsie Stain, MD      Allergies    Patient has no known allergies.    Review of Systems   Review of Systems  Constitutional:  Negative for fever.  Gastrointestinal:  Positive for abdominal pain. Negative for constipation, diarrhea, nausea and vomiting.  Genitourinary:  Negative for dysuria.    Physical Exam Updated Vital Signs BP 116/68   Pulse 68   Temp 97.8 F (36.6 C) (Oral)   Resp 17   Ht '5\' 11"'  (1.803 m)   Wt 86.2 kg   SpO2 97%   BMI 26.50 kg/m  Physical Exam Vitals and  nursing note reviewed.  Constitutional:      General: He is not in acute distress.    Appearance: He is well-developed. He is not ill-appearing, toxic-appearing or diaphoretic.  HENT:     Head: Normocephalic and atraumatic.  Eyes:     Extraocular Movements: Extraocular movements intact.     Pupils: Pupils are equal, round, and reactive to light.  Cardiovascular:     Rate and Rhythm: Normal rate and regular rhythm.  Pulmonary:     Effort: Pulmonary effort is normal.     Breath sounds: Normal breath sounds. No wheezing.  Abdominal:     General: Abdomen is flat. Bowel sounds are normal.     Palpations: Abdomen is soft.     Tenderness: There is abdominal tenderness in the right lower quadrant and periumbilical area.     Hernia: A hernia is present. Hernia is present in the umbilical area, left inguinal area and right inguinal area.     Comments: Umbilical hernia reduces when patient lies flat spontaneously. No overlying skin change to suggest incarcerated hernia. Patient denies umbilical abdominal pain.   Skin:    General: Skin is warm and dry.     Capillary Refill: Capillary refill takes less than 2 seconds.  Neurological:     Mental Status: He is alert and oriented to person, place, and time.  ED Results / Procedures / Treatments   Labs (all labs ordered are listed, but only abnormal results are displayed) Labs Reviewed  COMPREHENSIVE METABOLIC PANEL - Abnormal; Notable for the following components:      Result Value   Glucose, Bld 100 (*)    Total Bilirubin 1.3 (*)    All other components within normal limits  URINALYSIS, ROUTINE W REFLEX MICROSCOPIC - Abnormal; Notable for the following components:   APPearance HAZY (*)    Specific Gravity, Urine >1.046 (*)    Ketones, ur 20 (*)    All other components within normal limits  CBC WITH DIFFERENTIAL/PLATELET    EKG None  Radiology CT ABDOMEN PELVIS W CONTRAST  Result Date: 05/03/2022 CLINICAL DATA:  Hernia,  complicated EXAM: CT ABDOMEN AND PELVIS WITH CONTRAST TECHNIQUE: Multidetector CT imaging of the abdomen and pelvis was performed using the standard protocol following bolus administration of intravenous contrast. RADIATION DOSE REDUCTION: This exam was performed according to the departmental dose-optimization program which includes automated exposure control, adjustment of the mA and/or kV according to patient size and/or use of iterative reconstruction technique. CONTRAST:  163m OMNIPAQUE IOHEXOL 300 MG/ML  SOLN COMPARISON:  CT abdomen pelvis 03/15/2022 FINDINGS: Lower chest: No acute abnormality. Hepatobiliary: No focal liver abnormality is seen. Prior cholecystectomy with expected prominence of the intra and extrahepatic biliary ducts, similar to prior. Pancreas: Unremarkable. No pancreatic ductal dilatation or surrounding inflammatory changes. Spleen: Normal in size without focal abnormality. Adrenals/Urinary Tract: Adrenal glands are unremarkable. No hydronephrosis or nephrolithiasis. There is some higher density material layering along the posterior bladder (series 5, images 99-104). Stomach/Bowel: There are multiple dilated loops of small bowel, entering a right inguinal hernia, with focal small bowel fecalization within the hernia, and decompression at the hernia exit. There is mesenteric edema. Small bowel is decompressed distally. Prior appendectomy. Vascular/Lymphatic: No significant vascular findings are present. No enlarged abdominal or pelvic lymph nodes. Reproductive: Unremarkable. Other: Right inguinal hernia as above with which contains a dilated fecalized loop of small bowel and some fluid along the spermatic cord. Prior upper ventral hernia repair. There is a small umbilical hernia containing fat and a single wall of small loop of small bowel, with prominent small bowel proximally and distally. Small fat containing left inguinal hernia. Musculoskeletal: No acute osseous abnormality. No  suspicious osseous lesions. Degenerative changes of the spine. 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 IMPRESSION: Small-bowel obstruction due to a right inguinal hernia. Small umbilical hernia containing a single wall a loop of small bowel, with prominent small bowel proximally and distally, correlate with reducibility. Small fat containing left inguinal hernia. Small amount of higher density material layering along the posterior bladder, recommend correlation with urinalysis. Electronically Signed   By: JMaurine SimmeringM.D.   On: 05/03/2022 08:44    Procedures Procedures    Medications Ordered in ED Medications  sodium chloride (PF) 0.9 % injection (  Not Given 05/03/22 0807)  morphine (PF) 4 MG/ML injection 4 mg (4 mg Intravenous Given 05/03/22 0624)  ondansetron (ZOFRAN) injection 4 mg (4 mg Intravenous Given 05/03/22 0626)  iohexol (OMNIPAQUE) 300 MG/ML solution 100 mL (100 mLs Intravenous Contrast Given 05/03/22 0754)    ED Course/ Medical Decision Making/ A&P                           Medical Decision Making  51year old male presents to the ED for evaluation.  Please see HPI for further details.  On examination, the patient is afebrile and nontachycardic.  The patient lung sounds are clear bilaterally.  The patient's abdomen is soft and compressible all 4 quadrants however the patient does have a nonreducible right inguinal hernia.  Patient alert and oriented x3.  Patient follows commands appropriately.  Patient worked up utilizing the following labs and imaging studies interpreted by me personally: - Urinalysis shows increased [place] gravity, ketones.  Patient has shown the ability to handle oral intake in the department.  Patient will be advised to hydrate himself as an outpatient - CMP shows elevated glucose to 100 however noncontributory to symptoms - CBC unremarkable - CT abdomen pelvis shows small bowel obstruction due to right  inguinal hernia as well as umbilical hernia and left inguinal hernia.   Patient hernia reduced in the department.  Patient denies any pain.  General surgery, Dr. Johney Maine, consulted for further management.  Dr. Johney Maine stated that this patient, if his hernia was reduced, can follow-up as an outpatient this week in the office.  Patient advised to follow-up with general surgeon in office.  Patient given return precautions and voiced understanding.  Patient had all his questions answered to satisfaction.  The patient is stable for discharge home   Final Clinical Impression(s) / ED Diagnoses Final diagnoses:  Left inguinal hernia  Right inguinal hernia  Umbilical hernia with obstruction, without gangrene    Rx / DC Orders ED Discharge Orders     None         Azucena Cecil, PA-C 29/79/89 2119    Gray, [place] P, DO 41/74/08 [phone]

## 2022-05-03 NOTE — Discharge Instructions (Addendum)
Please return to the ED with any new symptoms such as inability to pass gas, inability to stool, fevers, nonreducible hernia Please follow-up with the general surgery team on Monday.  Please call and make an appointment to be seen by Dr. Luisa Hart.  The number is attached to this form. Please read attached informational guide concerning hernias for further information

## 2022-05-03 NOTE — ED Notes (Signed)
Patient refused pain medications. Patient states that hernia reduced itself while he was lying in bed. MD made aware.

## 2022-05-03 NOTE — ED Provider Triage Note (Signed)
  Emergency Medicine Provider Triage Evaluation Note  MRN:  [id number]  Arrival date & time: 05/03/22    Medically screening exam initiated at 6:10 AM.   CC:   Abdominal Pain   HPI:  Thomas Holt is a 51 y.o. year-old male presents to the ED with chief complaint of hernia.  Reports that he had significant pain from his R inguinal hernia earlier.  Reports that symptoms are improving, but are still present.  States he was supposed to have surgery, but couldn't get his finances in order.  History provided by History provided by patient. ROS:  -As included in HPI PE:   Vitals:   05/03/22 0547  BP: 131/84  Pulse: 72  Resp: 18  Temp: 97.8 F (36.6 C)  SpO2: 98%    Non-toxic appearing No respiratory distress R inguinal hernia, moderately TTP MDM:   I've ordered labs and imaging in triage to expedite lab/diagnostic workup.  Patient was informed that the remainder of the evaluation will be completed by another provider, this initial triage assessment does not replace that evaluation, and the importance of remaining in the ED until their evaluation is complete.    Roxy Horseman, PA-C 05/03/22 [phone]

## 2022-05-12 ENCOUNTER — Encounter: Payer: Self-pay | Admitting: Family Medicine

## 2022-05-20 NOTE — Progress Notes (Signed)
Surgical Instructions    Your procedure is scheduled on Thursday July 6th.  Report to [facility] Main Entrance "A" at 8 A.M., then check in with the Admitting office.  Call this number if you have problems the morning of surgery:  [phone]   If you have any questions prior to your surgery date call [phone]: Open Monday-Friday 8am-4pm    Remember:  Do not eat after midnight the night before your surgery  You may drink clear liquids until 7am the morning of your surgery.   Clear liquids allowed are: Water, Non-Citrus Juices (without pulp), Carbonated Beverages, Clear Tea, Black Coffee ONLY (NO MILK, CREAM OR POWDERED CREAMER of any kind), and Gatorade    Take these medicines the morning of surgery with A SIP OF WATER: NONE  WHAT DO I DO ABOUT MY DIABETES MEDICATION?   Do not take oral diabetes medicines (pills) the morning of surgery.  The day of surgery, do not take other diabetes injectables, including Byetta (exenatide), Bydureon (exenatide ER), Victoza (liraglutide), or Trulicity (dulaglutide).  If your CBG is greater than 220 mg/dL, you may take  of your sliding scale (correction) dose of insulin.   HOW TO MANAGE YOUR DIABETES BEFORE AND AFTER SURGERY  Why is it important to control my blood sugar before and after surgery? Improving blood sugar levels before and after surgery helps healing and can limit problems. A way of improving blood sugar control is eating a healthy diet by:  Eating less sugar and carbohydrates  Increasing activity/exercise  Talking with your doctor about reaching your blood sugar goals High blood sugars (greater than 180 mg/dL) can raise your risk of infections and slow your recovery, so you will need to focus on controlling your diabetes during the weeks before surgery. Make sure that the doctor who takes care of your diabetes knows about your planned surgery including the date and location.  How do I manage my blood sugar before  surgery? Check your blood sugar at least 4 times a day, starting 2 days before surgery, to make sure that the level is not too high or low.  Check your blood sugar the morning of your surgery when you wake up and every 2 hours until you get to the Short Stay unit.  If your blood sugar is less than 70 mg/dL, you will need to treat for low blood sugar: Do not take insulin. Treat a low blood sugar (less than 70 mg/dL) with  cup of clear juice (cranberry or apple), 4 glucose tablets, OR glucose gel. Recheck blood sugar in 15 minutes after treatment (to make sure it is greater than 70 mg/dL). If your blood sugar is not greater than 70 mg/dL on recheck, call [phone] for further instructions. Report your blood sugar to the short stay nurse when you get to Short Stay.  If you are admitted to the hospital after surgery: Your blood sugar will be checked by the staff and you will probably be given insulin after surgery (instead of oral diabetes medicines) to make sure you have good blood sugar levels. The goal for blood sugar control after surgery is 80-180 mg/dL.   As of today, STOP taking any Aspirin (unless otherwise instructed by your surgeon) Aleve, Naproxen, Ibuprofen, Motrin, Advil, Goody's, BC's, all herbal medications, fish oil, and all vitamins.           Do not wear jewelry  Do not wear lotions, powders, colognes, or deodorant. Do not shave 48 hours prior to surgery.  Men may shave face and neck. Do not bring valuables to the hospital. Do not wear nail polish, gel polish, artificial nails, or any other type of covering on natural nails (fingers and toes) If you have artificial nails or gel coating that need to be removed by a nail salon, please have this removed prior to surgery. Artificial nails or gel coating may interfere with anesthesia's ability to adequately monitor your vital signs.  [facility] is not responsible for any belongings or valuables. .   Do NOT Smoke  (Tobacco/Vaping)  24 hours prior to your procedure  If you use a CPAP at night, you may bring your mask for your overnight stay.   Contacts, glasses, hearing aids, dentures or partials may not be worn into surgery, please bring cases for these belongings   For patients admitted to the hospital, discharge time will be determined by your treatment team.   Patients discharged the day of surgery will not be allowed to drive home, and someone needs to stay with them for 24 hours.   SURGICAL WAITING ROOM VISITATION Patients having surgery or a procedure in a hospital may have two support people. Children under the age of 77 must have an adult with them who is not the patient. They may stay in the waiting area during the procedure and may switch out with other visitors. If the patient needs to stay at the hospital during part of their recovery, the visitor guidelines for inpatient rooms apply.  Please refer to the [facility] website for the visitor guidelines for Inpatients (after your surgery is over and you are in a regular room).       Special instructions:    Oral Hygiene is also important to reduce your risk of infection.  Remember - BRUSH YOUR TEETH THE MORNING OF SURGERY WITH YOUR REGULAR TOOTHPASTE   [facility]- Preparing For Surgery  Before surgery, you can play an important role. Because skin is not sterile, your skin needs to be as free of germs as possible. You can reduce the number of germs on your skin by washing with CHG (chlorahexidine gluconate) Soap before surgery.  CHG is an antiseptic cleaner which kills germs and bonds with the skin to continue killing germs even after washing.     Please do not use if you have an allergy to CHG or antibacterial soaps. If your skin becomes reddened/irritated stop using the CHG.  Do not shave (including legs and underarms) for at least 48 hours prior to first CHG shower. It is OK to shave your face.  Please follow these instructions  carefully.     Shower the NIGHT BEFORE SURGERY and the MORNING OF SURGERY with CHG Soap.   If you chose to wash your hair, wash your hair first as usual with your normal shampoo. After you shampoo, rinse your hair and body thoroughly to remove the shampoo.  Then [employer] and genitals (private parts) with your normal soap and rinse thoroughly to remove soap.  After that Use CHG Soap as you would any other liquid soap. You can apply CHG directly to the skin and wash gently with a scrungie or a clean washcloth.   Apply the CHG Soap to your body ONLY FROM THE NECK DOWN.  Do not use on open wounds or open sores. Avoid contact with your eyes, ears, mouth and genitals (private parts). Wash Face and genitals (private parts)  with your normal soap.   Wash thoroughly, paying special attention to the  area where your surgery will be performed.  Thoroughly rinse your body with warm water from the neck down.  DO NOT shower/wash with your normal soap after using and rinsing off the CHG Soap.  Pat yourself dry with a CLEAN TOWEL.  Wear CLEAN PAJAMAS to bed the night before surgery  Place CLEAN SHEETS on your bed the night before your surgery  DO NOT SLEEP WITH PETS.   Day of Surgery:  Take a shower with CHG soap. Wear Clean/Comfortable clothing the morning of surgery Do not apply any deodorants/lotions.   Remember to brush your teeth WITH YOUR REGULAR TOOTHPASTE.    If you received a COVID test during your pre-op visit, it is requested that you wear a mask when out in public, stay away from anyone that may not be feeling well, and notify your surgeon if you develop symptoms. If you have been in contact with anyone that has tested positive in the last 10 days, please notify your surgeon.    Please read over the following fact sheets that you were given.

## 2022-05-21 ENCOUNTER — Inpatient Hospital Stay (HOSPITAL_COMMUNITY)
Admission: RE | Admit: 2022-05-21 | Discharge: 2022-05-21 | Disposition: A | Discharge status: Home / Self Care | Payer: 59 | Source: Ambulatory Visit

## 2022-05-21 NOTE — Progress Notes (Signed)
Spoke with triage nurse at Dr. Rosezena Sensor office. She is to call back once speaking with Dr. Luisa Hart regarding if patient needs to have pre-op appointment.

## 2022-05-21 NOTE — Progress Notes (Signed)
Had sent staff message to Dr. Luisa Hart for pre admission orders. Dr. Luisa Hart saw patient earlier in the year but unaware of upcoming surgery.  Spoke to [employer], Systems developer at [employer] surgery, who is going to sort out what needs to occur.

## 2022-05-25 ENCOUNTER — Encounter (HOSPITAL_COMMUNITY): Payer: Self-pay | Admitting: Surgery

## 2022-05-25 ENCOUNTER — Other Ambulatory Visit: Payer: Self-pay

## 2022-05-25 ENCOUNTER — Ambulatory Visit: Payer: Self-pay | Admitting: Surgery

## 2022-05-25 NOTE — Progress Notes (Signed)
Spoke with pt for pre-op call. Pt denies cardiac history, HTN or Diabetes.  Shower instructions given to pt and he voiced understanding. 

## 2022-05-27 ENCOUNTER — Encounter (HOSPITAL_COMMUNITY): Payer: Self-pay | Admitting: Anesthesiology

## 2022-05-28 ENCOUNTER — Ambulatory Visit (HOSPITAL_COMMUNITY): Admission: RE | Admit: 2022-05-28 | Payer: 59 | Source: Home / Self Care | Admitting: Surgery

## 2022-05-28 HISTORY — DX: Pneumonia, unspecified organism: J18.9

## 2022-05-28 SURGERY — REPAIR, HERNIA, INGUINAL, ADULT
Anesthesia: General

## 2022-06-07 NOTE — Progress Notes (Deleted)
New Patient Office Visit  Subjective    Patient ID: Thomas Holt, male    DOB: Jan 23, 1971  Age: 51 y.o. MRN: [id number]  CC:  No chief complaint on file.   HPI Thomas Holt presents to establish care 02/2022 This patient is a employee of [employer] he works as a Chief of Staff.  In addition he also does some part-time work at [employer].  [facility] and had a series of labs drawn including renal panel liver panel blood counts urine study and PSA.  He will get these reports to me after he signs patient information release form.  They also suggest that he needs a colonoscopy.  We had seen the patient in the [facility] as a screening study exam.  At that visit he had complaints of right inguinal swelling.  He had a right inguinal hernia and we have already made a referral for him to general surgery and they have already seen the patient.  He is trying to save money for the co-pay he will need to have a open inguinal hernia repair with mesh placement with Dr. Brantley Holt.  Another issue is that he had an episode of presyncope with dizziness and fatigue and went to the emergency room a week ago.  They found he had ketones in the urine volume depletion blood sugar of 230 they gave him fluids and sent him home.  He has a history of significant obesity in the past and that he weighed over 250 pounds and over 2 years lost a significant amount of weight.  He comes in today to 190 pounds BMI of 26-1/2.  He does state he has some polyuria and polyphagia.  He does not have polydipsia.  He states his dizziness has improved somewhat.  He has been trying to eat more healthy food options.  He said at 1 point he was just eating too many bananas.  The patient does not have any other complaints at this time.  His mother did have diabetes.  Patient did have an open emergent appendectomy when he was 51 years of age at [facility] as his  only surgical history Only other complaint he has is about a mild upper quadrant tenderness in the abdomen at times The patient does not drink and does not use any risky substances  7/17 tdap colon  Hyperglycemia - Primary    History of hyperglycemia in the emergency room with mild ketones in the urine and volume depletion.  I suspect he may have diabetes.  The patient has had issues of significant obesity in the past he has had significant weight loss.  He needs to follow a healthier diet.  Plan is to check a hemoglobin Q5Z metabolic panel at this visit  I went over with the patient a lifestyle medicine handout including food choices that are more plant-based and increasing his exercise program  We may need to start metformin but lets wait on the A1c to return  Would also like to get labs he obtained at the [facility] he will sign a record release for this  I have also issued the patient a glucometer for him to check his blood sugars fasting each morning he will bring these results next visit       Relevant Orders   Basic Metabolic Panel   Hemoglobin A1c   Colon cancer screening    Referral to GI was made  for colon cancer screening       Relevant Orders   Ambulatory referral to Gastroenterology   BMI 26.0-26.9,adult    Patient's had remarkable weight loss at this point I gave him a lifestyle medicine handout to make adjustments in his diet and exercise program       Right inguinal hernia    Patient has a right inguinal hernia he can be cleared for planned surgery follow-up on the A1c results        A1C was NORMAL  hernia preadmit 07/14/22 Thomas Holt Went to [facility] for [facility] in April and June 2023  Outpatient Encounter Medications as of 06/08/2022  Medication Sig   Blood Glucose Monitoring Suppl (ONETOUCH VERIO) w/Device KIT Check blood sugar daily   glucose blood test strip Use as instructed   Lancets MISC Use to check blood sugar daily   No  facility-administered encounter medications on file as of 06/08/2022.    Past Medical History:  Diagnosis Date   Pneumonia     Past Surgical History:  Procedure Laterality Date   APPENDECTOMY      Family History  Problem Relation Age of Onset   Cancer Mother    Heart disease Father     Social History   Socioeconomic History   Marital status: Divorced    Spouse name: Not on file   Number of children: 1   Years of education: Not on file   Highest education level: Not on file  Occupational History   Occupation: Chief of Staff    Comment: [employer]  Tobacco Use   Smoking status: Never   Smokeless tobacco: Not on file  Vaping Use   Vaping Use: Not on file  Substance and Sexual Activity   Alcohol use: No   Drug use: No   Sexual activity: Not Currently    Birth control/protection: Condom  Other Topics Concern   Not on file  Social History Narrative   Not on file   Social Determinants of Health   Financial Resource Strain: Not on file  Food Insecurity: Not on file  Transportation Needs: Not on file  Physical Activity: Not on file  Stress: Not on file  Social Connections: Not on file  Intimate Partner Violence: Not on file    Review of Systems  Constitutional:  Negative for chills, diaphoresis, fever, malaise/fatigue and weight loss.  HENT:  Negative for congestion, hearing loss, nosebleeds, sore throat and tinnitus.   Eyes: Negative.  Negative for blurred vision, photophobia and redness.  Respiratory: Negative.  Negative for cough, hemoptysis, sputum production, shortness of breath, wheezing and stridor.   Cardiovascular: Negative.  Negative for chest pain, palpitations, orthopnea, claudication, leg swelling and PND.  Gastrointestinal:  Positive for abdominal pain. Negative for blood in stool, constipation, diarrhea, heartburn, melena, nausea and vomiting.       Polyphagia    Genitourinary:  Negative for dysuria, flank pain, frequency, hematuria and  urgency.       Nocturia polyuria  Musculoskeletal:  Negative for back pain, falls, joint pain, myalgias and neck pain.  Skin:  Negative for itching and rash.  Neurological:  Negative for dizziness, tingling, tremors, sensory change, speech change, focal weakness, seizures, loss of consciousness, weakness and headaches.  Endo/Heme/Allergies:  Negative for environmental allergies and polydipsia. Does not bruise/bleed easily.  Psychiatric/Behavioral:  Negative for depression, memory loss, substance abuse and suicidal ideas. The patient is not nervous/anxious and does not have insomnia.         Objective  There were no vitals taken for this visit.  Physical Exam Vitals reviewed.  Constitutional:      Appearance: Normal appearance. He is well-developed. He is not diaphoretic.  HENT:     Head: Normocephalic and atraumatic.     Right Ear: Tympanic membrane normal.     Left Ear: Tympanic membrane normal.     Nose: Nose normal. No nasal deformity, septal deviation, mucosal edema or rhinorrhea.     Right Sinus: No maxillary sinus tenderness or frontal sinus tenderness.     Left Sinus: No maxillary sinus tenderness or frontal sinus tenderness.     Mouth/Throat:     Mouth: Mucous membranes are moist.     Pharynx: Oropharynx is clear. No oropharyngeal exudate or posterior oropharyngeal erythema.  Eyes:     General: No scleral icterus.    Conjunctiva/sclera: Conjunctivae normal.     Pupils: Pupils are equal, round, and reactive to light.  Neck:     Thyroid: No thyromegaly.     Vascular: No carotid bruit or JVD.     Trachea: Trachea normal. No tracheal tenderness or tracheal deviation.  Cardiovascular:     Rate and Rhythm: Normal rate and regular rhythm.     Chest Wall: PMI is not displaced.     Pulses: Normal pulses. No decreased pulses.     Heart sounds: Normal heart sounds, S1 normal and S2 normal. Heart sounds not distant. No murmur heard.    No systolic murmur is present.     No  diastolic murmur is present.     No friction rub. No gallop. No S3 or S4 sounds.  Pulmonary:     Effort: No tachypnea, accessory muscle usage or respiratory distress.     Breath sounds: No stridor. No decreased breath sounds, wheezing, rhonchi or rales.  Chest:     Chest wall: No tenderness.  Abdominal:     General: Bowel sounds are normal. There is no distension.     Palpations: Abdomen is soft. Abdomen is not rigid.     Tenderness: There is no abdominal tenderness. There is no guarding or rebound.     Hernia: A hernia is present.     Comments: Old healed scar right upper quadrant Abdomen is nontender and liver is not enlarged Right inguinal hernia is reducible nontender no gangrene  Musculoskeletal:        General: Normal range of motion.     Cervical back: Normal range of motion and neck supple. No edema, erythema or rigidity. No muscular tenderness. Normal range of motion.  Lymphadenopathy:     Head:     Right side of head: No submental or submandibular adenopathy.     Left side of head: No submental or submandibular adenopathy.     Cervical: No cervical adenopathy.  Skin:    General: Skin is warm and dry.     Coloration: Skin is not pale.     Findings: No rash.     Nails: There is no clubbing.  Neurological:     General: No focal deficit present.     Mental Status: He is alert and oriented to person, place, and time.     Sensory: No sensory deficit.  Psychiatric:        Mood and Affect: Mood normal.        Speech: Speech normal.        Behavior: Behavior normal.        Thought Content: Thought content normal.  Judgment: Judgment normal.        Assessment & Plan:   Problem List Items Addressed This Visit   None  No follow-ups on file.   Asencion Noble, MD

## 2022-06-08 ENCOUNTER — Ambulatory Visit: Payer: 59 | Admitting: Critical Care Medicine

## 2022-06-15 ENCOUNTER — Encounter (HOSPITAL_COMMUNITY): Payer: Self-pay

## 2022-06-15 ENCOUNTER — Ambulatory Visit (HOSPITAL_COMMUNITY)
Admission: EM | Admit: 2022-06-15 | Discharge: 2022-06-15 | Disposition: A | Discharge status: Home / Self Care | Payer: 59 | Attending: Family Medicine | Admitting: Family Medicine

## 2022-06-15 DIAGNOSIS — K047 Periapical abscess without sinus: Secondary | ICD-10-CM | POA: Diagnosis not present

## 2022-06-15 DIAGNOSIS — H9202 Otalgia, left ear: Secondary | ICD-10-CM

## 2022-06-15 MED ORDER — IBUPROFEN 600 MG PO TABS: 600.0000 mg | ORAL_TABLET | Freq: Four times a day (QID) | ORAL | 0 refills | Status: DC | PRN

## 2022-06-15 MED ORDER — AMOXICILLIN 875 MG PO TABS: 875.0000 mg | ORAL_TABLET | Freq: Two times a day (BID) | ORAL | 0 refills | Status: AC

## 2022-06-15 NOTE — Discharge Instructions (Signed)
Follow-up with your dentist.  Your ears appear fine however I think your left-sided ear pain is related to infection involving her gums.  Complete antibiotics as prescribed.

## 2022-06-15 NOTE — ED Provider Notes (Signed)
[facility]    CSN: [id number] Arrival date & time: 06/15/22  1302      History   Chief Complaint Chief Complaint  Patient presents with   Otalgia    HPI Thomas Holt is a 51 y.o. male.   HPI Patient presents today with intermittent left ear pain for few days. He reports he is also having lower left molar tooth pain. His molar tooth is broken.  He denies any active URI symptoms.  Denies any changes in hearing.  He is afebrile. Reports that he plans to schedule a dental appointment for follow-up of tooth problem. Past Medical History:  Diagnosis Date   Pneumonia     Patient Active Problem List   Diagnosis Date Noted   Hyperglycemia 03/11/2022   Colon cancer screening 03/11/2022   BMI 26.0-26.9,adult 03/11/2022   Right inguinal hernia 03/11/2022    Past Surgical History:  Procedure Laterality Date   APPENDECTOMY         Home Medications    Prior to Admission medications   Medication Sig Start Date End Date Taking? Authorizing Provider  amoxicillin (AMOXIL) 875 MG tablet Take 1 tablet (875 mg total) by mouth 2 (two) times daily for 7 days. 06/15/22 06/22/22 Yes Scot Jun, FNP  ibuprofen (ADVIL) 600 MG tablet Take 1 tablet (600 mg total) by mouth every 6 (six) hours as needed. 06/15/22  Yes Scot Jun, FNP  Blood Glucose Monitoring Suppl [facility] VERIO) w/Device KIT Check blood sugar daily 03/11/22   Elsie Stain, MD  glucose blood test strip Use as instructed 03/11/22   Elsie Stain, MD  Lancets MISC Use to check blood sugar daily 03/11/22   Elsie Stain, MD    Family History Family History  Problem Relation Age of Onset   Cancer Mother    Heart disease Father     Social History Social History   Tobacco Use   Smoking status: Never  Vaping Use   Vaping Use: Never used  Substance Use Topics   Alcohol use: No   Drug use: No     Allergies   Patient has no known allergies.   Review of Systems Review of  Systems Pertinent negatives listed in HPI   Physical Exam Triage Vital Signs ED Triage Vitals [06/15/22 1407]  Enc Vitals Group     BP 107/73     Pulse Rate (!) 57     Resp 18     Temp 97.6 F (36.4 C)     Temp Source Oral     SpO2 95 %     Weight      Height      Head Circumference      Peak Flow      Pain Score      Pain Loc      Pain Edu?      Excl. in [place]?    No data found.  Updated Vital Signs BP 107/73 (BP Location: Left Arm)   Pulse (!) 57   Temp 97.6 F (36.4 C) (Oral)   Resp 18   SpO2 95%   Visual Acuity Right Eye Distance:   Left Eye Distance:   Bilateral Distance:    Right Eye Near:   Left Eye Near:    Bilateral Near:     Physical Exam Constitutional:      Appearance: Normal appearance.  HENT:     Head: Normocephalic and atraumatic.     Right Ear: Tympanic membrane,  ear canal and external ear normal.     Left Ear: Ear canal and external ear normal.     Nose: Nose normal. No congestion or rhinorrhea.     Mouth/Throat:     Dentition: Abnormal dentition. Gingival swelling and dental caries present.  Eyes:     Extraocular Movements: Extraocular movements intact.     Pupils: Pupils are equal, round, and reactive to light.  Cardiovascular:     Rate and Rhythm: Normal rate and regular rhythm.  Pulmonary:     Effort: Pulmonary effort is normal.     Breath sounds: Normal breath sounds.  Neurological:     Mental Status: He is alert.  Psychiatric:        Attention and Perception: Attention and perception normal.        Mood and Affect: Mood and affect normal.        Speech: Speech normal.    UC Treatments / Results  Labs (all labs ordered are listed, but only abnormal results are displayed) Labs Reviewed - No data to display  EKG   Radiology No results found.  Procedures Procedures (including critical care time)  Medications Ordered in UC Medications - No data to display  Initial Impression / Assessment and Plan / UC Course  I have  reviewed the triage vital signs and the nursing notes.  Pertinent labs & imaging results that were available during my care of the patient were reviewed by me and considered in my medical decision making (see chart for details).    Treating for suspected dental infection which is likely causing ear pain as bilateral ears on exam are unremarkable.  Suspect pain in left ear is referred from dental infection related to broken left molar tooth.  We will treat empirically with amoxicillin twice daily for 7 days along with ibuprofen as needed for pain.  Advised to schedule follow-up with dental provider.  Return if symptoms worsen or do not improve. Final Clinical Impressions(s) / UC Diagnoses   Final diagnoses:  Dental infection ,left lower molar   Left ear pain     Discharge Instructions      Follow-up with your dentist.  Your ears appear fine however I think your left-sided ear pain is related to infection involving her gums.  Complete antibiotics as prescribed.     ED Prescriptions     Medication Sig Dispense Auth. Provider   ibuprofen (ADVIL) 600 MG tablet Take 1 tablet (600 mg total) by mouth every 6 (six) hours as needed. 30 tablet Scot Jun, FNP   amoxicillin (AMOXIL) 875 MG tablet Take 1 tablet (875 mg total) by mouth 2 (two) times daily for 7 days. 14 tablet Scot Jun, FNP      PDMP not reviewed this encounter.   Scot Jun, FNP 06/15/22 [id number]

## 2022-06-15 NOTE — ED Triage Notes (Signed)
C/O intermittent left ear pain.

## 2022-06-18 ENCOUNTER — Ambulatory Visit: Payer: 59 | Attending: Critical Care Medicine | Admitting: Critical Care Medicine

## 2022-06-18 ENCOUNTER — Encounter: Payer: Self-pay | Admitting: Critical Care Medicine

## 2022-06-18 VITALS — BP 122/81 | HR 76 | Temp 98.3°F | Ht 71.0 in

## 2022-06-18 DIAGNOSIS — Z131 Encounter for screening for diabetes mellitus: Secondary | ICD-10-CM | POA: Diagnosis not present

## 2022-06-18 DIAGNOSIS — K047 Periapical abscess without sinus: Secondary | ICD-10-CM | POA: Diagnosis not present

## 2022-06-18 DIAGNOSIS — Z1211 Encounter for screening for malignant neoplasm of colon: Secondary | ICD-10-CM | POA: Diagnosis not present

## 2022-06-18 DIAGNOSIS — R739 Hyperglycemia, unspecified: Secondary | ICD-10-CM

## 2022-06-18 LAB — POCT GLYCOSYLATED HEMOGLOBIN (HGB A1C): Hemoglobin A1C: 5.1 % (ref 4.0–5.6)

## 2022-06-18 NOTE — Assessment & Plan Note (Signed)
Patient to complete course of therapy with amoxicillin  Continue ibuprofen high dose  Patient encouraged to make a dental appointment he was given a list of dental resources

## 2022-06-18 NOTE — Progress Notes (Signed)
New Patient Office Visit  Subjective    Patient ID: Thomas Holt, male    DOB: September 04, 1971  Age: 51 y.o. MRN: [id number]  CC:  Chief Complaint  Patient presents with   Establish Care    HPI Thomas Holt presents to establish care 02/2022 This patient is a employee of [employer] he works as a Chief of Staff.  In addition he also does some part-time work at [employer].  [facility] and had a series of labs drawn including renal panel liver panel blood counts urine study and PSA.  He will get these reports to me after he signs patient information release form.  They also suggest that he needs a colonoscopy.  We had seen the patient in the [facility] as a screening study exam.  At that visit he had complaints of right inguinal swelling.  He had a right inguinal hernia and we have already made a referral for him to general surgery and they have already seen the patient.  He is trying to save money for the co-pay he will need to have a open inguinal hernia repair with mesh placement with Dr. Brantley Stage.  Another issue is that he had an episode of presyncope with dizziness and fatigue and went to the emergency room a week ago.  They found he had ketones in the urine volume depletion blood sugar of 230 they gave him fluids and sent him home.  He has a history of significant obesity in the past and that he weighed over 250 pounds and over 2 years lost a significant amount of weight.  He comes in today to 190 pounds BMI of 26-1/2.  He does state he has some polyuria and polyphagia.  He does not have polydipsia.  He states his dizziness has improved somewhat.  He has been trying to eat more healthy food options.  He said at 1 point he was just eating too many bananas.  The patient does not have any other complaints at this time.  His mother did have diabetes.  Patient did have an open emergent appendectomy when he was 51 years of  age at [facility] as his only surgical history Only other complaint he has is about a mild upper quadrant tenderness in the abdomen at times The patient does not drink and does not use any risky substances  7/27 Patient recently seen for left lower jaw pain found to have abscess molar with carious teeth.  He was prescribed amoxicillin and high-dose ibuprofen this was 2 days ago.  Patient is here for follow-up.  On arrival blood pressure 122/81.  He states his vision is okay he has some left ear pain but no headaches.  A1c on arrival 5.1.  He never picked up any glucose testing supplies.  He has been trying to eat a healthier diet. He is awaiting GI colonoscopy.  Outpatient Encounter Medications as of 06/18/2022  Medication Sig   amoxicillin (AMOXIL) 875 MG tablet Take 1 tablet (875 mg total) by mouth 2 (two) times daily for 7 days.   ibuprofen (ADVIL) 600 MG tablet Take 1 tablet (600 mg total) by mouth every 6 (six) hours as needed.   [DISCONTINUED] Blood Glucose Monitoring Suppl (ONETOUCH VERIO) w/Device KIT Check blood sugar daily (Patient not taking: Reported on 06/18/2022)   [DISCONTINUED] glucose blood test strip Use as instructed (Patient not taking: Reported on 06/18/2022)   [DISCONTINUED] Lancets MISC Use  to check blood sugar daily (Patient not taking: Reported on 06/18/2022)   No facility-administered encounter medications on file as of 06/18/2022.    Past Medical History:  Diagnosis Date   Hyperglycemia 03/11/2022   Pneumonia     Past Surgical History:  Procedure Laterality Date   APPENDECTOMY      Family History  Problem Relation Age of Onset   Cancer Mother    Heart disease Father     Social History   Socioeconomic History   Marital status: Divorced    Spouse name: Not on file   Number of children: 1   Years of education: Not on file   Highest education level: Not on file  Occupational History   Occupation: Chief of Staff    Comment: [employer]   Tobacco Use   Smoking status: Never   Smokeless tobacco: Not on file  Vaping Use   Vaping Use: Never used  Substance and Sexual Activity   Alcohol use: No   Drug use: No   Sexual activity: Not Currently    Birth control/protection: Condom  Other Topics Concern   Not on file  Social History Narrative   Not on file   Social Determinants of Health   Financial Resource Strain: Not on file  Food Insecurity: Not on file  Transportation Needs: Not on file  Physical Activity: Not on file  Stress: Not on file  Social Connections: Not on file  Intimate Partner Violence: Not on file    Review of Systems  Constitutional:  Negative for chills, diaphoresis, fever, malaise/fatigue and weight loss.  HENT:  Negative for congestion, hearing loss, nosebleeds, sore throat and tinnitus.        Dental pain  Eyes: Negative.  Negative for blurred vision, photophobia and redness.  Respiratory: Negative.  Negative for cough, hemoptysis, sputum production, shortness of breath, wheezing and stridor.   Cardiovascular: Negative.  Negative for chest pain, palpitations, orthopnea, claudication, leg swelling and PND.  Gastrointestinal:  Negative for abdominal pain, blood in stool, constipation, diarrhea, heartburn, melena, nausea and vomiting.       Polyphagia    Genitourinary:  Negative for dysuria, flank pain, frequency, hematuria and urgency.       Nocturia polyuria  Musculoskeletal:  Negative for back pain, falls, joint pain, myalgias and neck pain.  Skin:  Negative for itching and rash.  Neurological:  Negative for dizziness, tingling, tremors, sensory change, speech change, focal weakness, seizures, loss of consciousness, weakness and headaches.  Endo/Heme/Allergies:  Negative for environmental allergies and polydipsia. Does not bruise/bleed easily.  Psychiatric/Behavioral:  Negative for depression, memory loss, substance abuse and suicidal ideas. The patient is not nervous/anxious and does not  have insomnia.         Objective    BP 122/81   Pulse 76   Temp 98.3 F (36.8 C) (Oral)   Ht '5\' 11"'  (1.803 m)   BMI 26.50 kg/m   Physical Exam Vitals reviewed.  Constitutional:      Appearance: Normal appearance. He is well-developed. He is not diaphoretic.  HENT:     Head: Normocephalic and atraumatic.     Right Ear: Tympanic membrane normal.     Left Ear: Tympanic membrane normal.     Nose: Congestion and rhinorrhea present. No nasal deformity, septal deviation or mucosal edema.     Right Sinus: No maxillary sinus tenderness or frontal sinus tenderness.     Left Sinus: No maxillary sinus tenderness or frontal sinus tenderness.  Mouth/Throat:     Mouth: Mucous membranes are moist.     Pharynx: Oropharynx is clear. No oropharyngeal exudate or posterior oropharyngeal erythema.     Comments: Erythema and swelling in the gum around the last molar left lower jaw with severe dental caries of that molar as well as other molars and left and right upper and lower jaws Eyes:     General: No scleral icterus.    Conjunctiva/sclera: Conjunctivae normal.     Pupils: Pupils are equal, round, and reactive to light.  Neck:     Thyroid: No thyromegaly.     Vascular: No carotid bruit or JVD.     Trachea: Trachea normal. No tracheal tenderness or tracheal deviation.  Cardiovascular:     Rate and Rhythm: Normal rate and regular rhythm.     Chest Wall: PMI is not displaced.     Pulses: Normal pulses. No decreased pulses.     Heart sounds: Normal heart sounds, S1 normal and S2 normal. Heart sounds not distant. No murmur heard.    No systolic murmur is present.     No diastolic murmur is present.     No friction rub. No gallop. No S3 or S4 sounds.  Pulmonary:     Effort: No tachypnea, accessory muscle usage or respiratory distress.     Breath sounds: No stridor. No decreased breath sounds, wheezing, rhonchi or rales.  Chest:     Chest wall: No tenderness.  Abdominal:     General:  Bowel sounds are normal. There is no distension.     Palpations: Abdomen is soft. Abdomen is not rigid.     Tenderness: There is no abdominal tenderness. There is no guarding or rebound.     Hernia: A hernia is present.  Musculoskeletal:        General: Normal range of motion.     Cervical back: Normal range of motion and neck supple. No edema, erythema or rigidity. No muscular tenderness. Normal range of motion.  Lymphadenopathy:     Head:     Right side of head: No submental or submandibular adenopathy.     Left side of head: No submental or submandibular adenopathy.     Cervical: No cervical adenopathy.  Skin:    General: Skin is warm and dry.     Coloration: Skin is not pale.     Findings: No rash.     Nails: There is no clubbing.  Neurological:     General: No focal deficit present.     Mental Status: He is alert and oriented to person, place, and time.     Sensory: No sensory deficit.  Psychiatric:        Mood and Affect: Mood normal.        Speech: Speech normal.        Behavior: Behavior normal.        Thought Content: Thought content normal.        Judgment: Judgment normal.         Assessment & Plan:   Problem List Items Addressed This Visit       Digestive   Dental abscess    Patient to complete course of therapy with amoxicillin  Continue ibuprofen high dose  Patient encouraged to make a dental appointment he was given a list of dental resources        Other   Colon cancer screening    Awaiting scheduling of colonoscopy      RESOLVED: Hyperglycemia  A1c now normal blood sugar normal he does not need to have glucose testing he will discontinue to follow a healthy diet      Other Visit Diagnoses     Screening for diabetes mellitus (DM)    -  Primary   Relevant Orders   POCT glycosylated hemoglobin (Hb A1C) (Completed)      Return in about 5 months (around 11/18/2022) for chronic conditions.   Asencion Noble, MD

## 2022-06-18 NOTE — Assessment & Plan Note (Signed)
Awaiting scheduling of colonoscopy

## 2022-06-18 NOTE — Assessment & Plan Note (Signed)
A1c now normal blood sugar normal he does not need to have glucose testing he will discontinue to follow a healthy diet

## 2022-06-18 NOTE — Patient Instructions (Signed)
Please obtain a dentist  Your A1C is normal  no need for glucose meter  Stay on healthy diet per handout  Return to see Dr Delford Field 5 months

## 2022-06-20 ENCOUNTER — Other Ambulatory Visit: Payer: Self-pay

## 2022-06-20 ENCOUNTER — Encounter (HOSPITAL_COMMUNITY): Payer: Self-pay

## 2022-06-20 ENCOUNTER — Emergency Department (HOSPITAL_COMMUNITY): Payer: 59

## 2022-06-20 ENCOUNTER — Emergency Department (HOSPITAL_COMMUNITY)
Admission: EM | Admit: 2022-06-20 | Discharge: 2022-06-20 | Disposition: A | Discharge status: Home / Self Care | Payer: 59 | Attending: Emergency Medicine | Admitting: Emergency Medicine

## 2022-06-20 DIAGNOSIS — F419 Anxiety disorder, unspecified: Secondary | ICD-10-CM | POA: Insufficient documentation

## 2022-06-20 DIAGNOSIS — R55 Syncope and collapse: Secondary | ICD-10-CM | POA: Diagnosis present

## 2022-06-20 LAB — BASIC METABOLIC PANEL
Anion gap: 7 (ref 5–15)
BUN: 12 mg/dL (ref 6–20)
CO2: 23 mmol/L (ref 22–32)
Calcium: 8.8 mg/dL — ABNORMAL LOW (ref 8.9–10.3)
Chloride: 108 mmol/L (ref 98–111)
Creatinine, Ser: 1.01 mg/dL (ref 0.61–1.24)
GFR, Estimated: >60 mL/min (ref >60)
Glucose, Bld: 88 mg/dL (ref 70–99)
Potassium: 3.7 mmol/L (ref 3.5–5.1)
Sodium: 138 mmol/L (ref 135–145)

## 2022-06-20 LAB — CBC WITH DIFFERENTIAL/PLATELET
Abs Immature Granulocytes: 0.02 10*3/uL (ref 0.00–0.07)
Basophils Absolute: 0 10*3/uL (ref 0.0–0.1)
Basophils Relative: 0 %
Eosinophils Absolute: 0 10*3/uL (ref 0.0–0.5)
Eosinophils Relative: 0 %
HCT: 44 % (ref 39.0–52.0)
Hemoglobin: 14.5 g/dL (ref 13.0–17.0)
Immature Granulocytes: 0 %
Lymphocytes Relative: 33 %
Lymphs Abs: 2.5 10*3/uL (ref 0.7–4.0)
MCH: 30.4 pg (ref 26.0–34.0)
MCHC: 33 g/dL (ref 30.0–36.0)
MCV: 92.2 fL (ref 80.0–100.0)
Monocytes Absolute: 0.4 10*3/uL (ref 0.1–1.0)
Monocytes Relative: 6 %
Neutro Abs: 4.5 10*3/uL (ref 1.7–7.7)
Neutrophils Relative %: 61 %
Platelets: 191 10*3/uL (ref 150–400)
RBC: 4.77 MIL/uL (ref 4.22–5.81)
RDW: 11.8 % (ref 11.5–15.5)
WBC: 7.5 10*3/uL (ref 4.0–10.5)
nRBC: 0 % (ref 0.0–0.2)

## 2022-06-20 NOTE — ED Triage Notes (Signed)
PTAR states pt coming from home and pt c/o "not feeling right" denies any pain or sob.

## 2022-06-20 NOTE — Discharge Instructions (Signed)
Your evaluation today was reassuring, could have potentially been related to anxiety, if you continue to have similar episodes please follow-up closely with your primary care provider.

## 2022-06-20 NOTE — ED Provider Notes (Signed)
[facility] EMERGENCY DEPARTMENT Provider Note   CSN: [id number] Arrival date & time: 06/20/22  1205     History  Chief Complaint  Patient presents with   Near Syncope    Thomas Holt is a 51 y.o. male.  Thomas Holt is a 51 y.o. male with a history of hyperglycemia, recently seen for dental abscess, who presents to the ED via EMS reporting an episode where he did not feel right.  Patient reports that earlier today he was at the grocery store when he suddenly felt like he was breathing harder than usual and felt weak so he left the grocery store and went to sit down.  He reports after resting for a few minutes symptoms seem to resolve and he reports that in total the episode lasted about 5 minutes.  He denies any associated chest pain or palpitations.  He reports that he did not pass out or lose consciousness but just felt weak and fatigued.  No abdominal pain, nausea vomiting or diarrhea.  No headache or visual changes, no numbness or weakness.  No seizure-like activity witnessed.  Patient reports after resting at home his symptoms resolved but he was concerned and wanted to get checked out.  He is unsure if this could have potentially been due to anxiety.  He does report that he feels anxious intermittently.  The history is provided by the patient.  Anxiety Associated symptoms include shortness of breath. Pertinent negatives include no chest pain, no abdominal pain and no headaches.       Home Medications Prior to Admission medications   Medication Sig Start Date End Date Taking? Authorizing Provider  amoxicillin (AMOXIL) 875 MG tablet Take 1 tablet (875 mg total) by mouth 2 (two) times daily for 7 days. 06/15/22 06/22/22  Bing Neighbors, FNP  ibuprofen (ADVIL) 600 MG tablet Take 1 tablet (600 mg total) by mouth every 6 (six) hours as needed. 06/15/22   Bing Neighbors, FNP      Allergies    Patient has no known allergies.    Review of Systems   Review  of Systems  Constitutional:  Negative for chills and fever.  Eyes:  Negative for visual disturbance.  Respiratory:  Positive for shortness of breath.   Cardiovascular:  Negative for chest pain, palpitations and leg swelling.  Gastrointestinal:  Negative for abdominal pain, diarrhea, nausea and vomiting.  Neurological:  Positive for weakness. Negative for dizziness, seizures, syncope, light-headedness, numbness and headaches.    Physical Exam Updated Vital Signs BP 118/82 (BP Location: Right Arm)   Pulse 60   Temp 98.3 F (36.8 C) (Oral)   Resp 20   Ht 5\' 11"  (1.803 m)   Wt 86.2 kg   SpO2 99%   BMI 26.50 kg/m  Physical Exam Vitals and nursing note reviewed.  Constitutional:      General: He is not in acute distress.    Appearance: Normal appearance. He is well-developed. He is not diaphoretic.  HENT:     Head: Normocephalic and atraumatic.  Eyes:     General:        Right eye: No discharge.        Left eye: No discharge.     Pupils: Pupils are equal, round, and reactive to light.  Cardiovascular:     Rate and Rhythm: Normal rate and regular rhythm.     Pulses: Normal pulses.     Heart sounds: Normal heart sounds.  Pulmonary:  Effort: Pulmonary effort is normal. No respiratory distress.     Breath sounds: Normal breath sounds. No wheezing or rales.     Comments: Respirations equal and unlabored, patient able to speak in full sentences, lungs clear to auscultation bilaterally  Abdominal:     General: Bowel sounds are normal. There is no distension.     Palpations: Abdomen is soft. There is no mass.     Tenderness: There is no abdominal tenderness. There is no guarding.     Comments: Abdomen soft, nondistended, nontender to palpation in all quadrants without guarding or peritoneal signs  Musculoskeletal:        General: No deformity.     Cervical back: Neck supple.  Skin:    General: Skin is warm and dry.     Capillary Refill: Capillary refill takes less than 2  seconds.  Neurological:     Mental Status: He is alert and oriented to person, place, and time.     Coordination: Coordination normal.     Comments: Speech is clear, able to follow commands CN III-XII intact Normal strength in upper and lower extremities bilaterally including dorsiflexion and plantar flexion, strong and equal grip strength Sensation normal to light and sharp touch Moves extremities without ataxia, coordination intact  Psychiatric:        Mood and Affect: Mood normal.        Behavior: Behavior normal.     ED Results / Procedures / Treatments   Labs (all labs ordered are listed, but only abnormal results are displayed) Labs Reviewed  BASIC METABOLIC PANEL - Abnormal; Notable for the following components:      Result Value   Calcium 8.8 (*)    All other components within normal limits  CBC WITH DIFFERENTIAL/PLATELET  CBG MONITORING, ED    EKG None  Radiology DG Chest 2 View  Result Date: 06/20/2022 CLINICAL DATA:  Shortness of breath EXAM: CHEST - 2 VIEW COMPARISON:  05/17/2013 FINDINGS: The heart size and mediastinal contours are within normal limits. Both lungs are clear. The visualized skeletal structures are unremarkable. IMPRESSION: No active cardiopulmonary disease. Electronically Signed   By: Duanne Guess D.O.   On: 06/20/2022 14:10    Procedures Procedures    Medications Ordered in ED Medications - No data to display  ED Course/ Medical Decision Making/ A&P                           Medical Decision Making Amount and/or Complexity of Data Reviewed Labs: ordered. Radiology: ordered.   51 y.o. male presents to the ED with complaints of episode of "not feeling right" where patient reports feeling weak with brief increased work of breathing near syncope, this involves an extensive number of treatment options, and is a complaint that carries with it a high risk of complications and morbidity.  The differential diagnosis includes near syncope,  anxiety, hypoglycemia, electrolyte derangement, TIA, seizure   On arrival pt is nontoxic, vitals WNL. Exam overall unremarkable  Additional history obtained from chart review. Previous records obtained and reviewed   EKG: Normal sinus rhythm without ischemic changes  Lab Tests:  I Ordered, reviewed, and interpreted labs, which included: No leukocytosis and normal hemoglobin, no significant electrolyte derangements, no hypoglycemia, normal renal function  Imaging Studies ordered:  I ordered imaging studies which included chest x-ray, I independently visualized and interpreted imaging which showed no acute cardiopulmonary disease  ED Course:   Patient's work-up is  overall been reassuring, suspect anxiety or near syncopal event, lower suspicion for TIA as patient did not have unilateral weakness, no reported headache, no numbness and no speech changes.  Symptoms are very nonspecific but evaluation and presentation here has been reassuring.  Recommend patient follow-up closely with PCP.  Return precautions discussed.  At this time there does not appear to be any evidence of an acute emergency medical condition requiring further emergent evaluation and the patient appears stable for discharge with appropriate outpatient follow up. Diagnosis and return precautions discussed with patient who verbalizes understanding and is agreeable to discharge.      Portions of this note were generated with Scientist, clinical (histocompatibility and immunogenetics). Dictation errors may occur despite best attempts at proofreading.         Final Clinical Impression(s) / ED Diagnoses Final diagnoses:  Near syncope    Rx / DC Orders ED Discharge Orders     None         Legrand Rams 06/20/22 1715    Virgina Norfolk, DO 06/21/22 [phone]

## 2022-06-22 ENCOUNTER — Telehealth: Payer: Self-pay | Admitting: Emergency Medicine

## 2022-06-22 ENCOUNTER — Telehealth: Payer: Self-pay | Admitting: Critical Care Medicine

## 2022-06-22 ENCOUNTER — Ambulatory Visit: Payer: 59 | Admitting: Critical Care Medicine

## 2022-06-22 NOTE — Telephone Encounter (Signed)
I called the patient to check in with him he had been in the ER this weekend and his work-up was negative he had a near syncopal episode.  I think he was over heated and dehydrated.  He feels better this morning.  I reminded him of his upcoming surgery in August on his hernias and need for him to have someone to look after him overnight after the surgery

## 2022-06-22 NOTE — Telephone Encounter (Signed)
Copied from CRM [id number]. Topic: General - Other >> Jun 22, 2022  1:17 PM Lyman Speller wrote: Reason for CRM: Pts brother would like a call from Dr. Delford Field about being with pt all day due to surgery / please advise asap

## 2022-06-26 NOTE — Telephone Encounter (Signed)
Called patient brother to see what request he had. Unable to make contact/leave voicemail due to mail being full

## 2022-07-06 NOTE — Pre-Procedure Instructions (Signed)
Surgical Instructions    Your procedure is scheduled on Tuesday, August 22nd.  Report to [facility] Main Entrance "A" at 5:30 A.M., then check in with the Admitting office.  Call this number if you have problems the morning of surgery:  [phone]   If you have any questions prior to your surgery date call [phone]: Open Monday-Friday 8am-4pm    Remember:  Do not eat after midnight the night before your surgery  You may drink clear liquids until 4:30 a.m. the morning of your surgery.   Clear liquids allowed are: Water, Non-Citrus Juices (without pulp), Carbonated Beverages, Clear Tea, Black Coffee ONLY (NO MILK, CREAM OR POWDERED CREAMER of any kind), and Gatorade.    Take these medicines the morning of surgery with A SIP OF WATER: NONE   As of today, STOP taking any Aspirin (unless otherwise instructed by your surgeon) Aleve, Naproxen, Ibuprofen, Motrin, Advil, Goody's, BC's, all herbal medications, fish oil, and all vitamins.           Do not wear jewelry. Do not wear lotions, powders, cologne or deodorant. Men may shave face and neck. Do not bring valuables to the hospital.  [facility] is not responsible for any belongings or valuables.    Do NOT Smoke (Tobacco/Vaping)  24 hours prior to your procedure  If you use a CPAP at night, you may bring your mask for your overnight stay.   Contacts, glasses, hearing aids, dentures or partials may not be worn into surgery, please bring cases for these belongings   For patients admitted to the hospital, discharge time will be determined by your treatment team.   Patients discharged the day of surgery will not be allowed to drive home, and someone needs to stay with them for 24 hours.   SURGICAL WAITING ROOM VISITATION Patients having surgery or a procedure may have no more than 2 support people in the waiting area - these visitors may rotate.   Children under the age of 22 must have an adult with them who is not the  patient. If the patient needs to stay at the hospital during part of their recovery, the visitor guidelines for inpatient rooms apply. Pre-op nurse will coordinate an appropriate time for 1 support person to accompany patient in pre-op.  This support person may not rotate.   Please refer to the [facility] website for the visitor guidelines for Inpatients (after your surgery is over and you are in a regular room).    Special instructions:    Oral Hygiene is also important to reduce your risk of infection.  Remember - BRUSH YOUR TEETH THE MORNING OF SURGERY WITH YOUR REGULAR TOOTHPASTE   [place]- Preparing For Surgery  Before surgery, you can play an important role. Because skin is not sterile, your skin needs to be as free of germs as possible. You can reduce the number of germs on your skin by washing with CHG (chlorahexidine gluconate) Soap before surgery.  CHG is an antiseptic cleaner which kills germs and bonds with the skin to continue killing germs even after washing.     Please do not use if you have an allergy to CHG or antibacterial soaps. If your skin becomes reddened/irritated stop using the CHG.  Do not shave (including legs and underarms) for at least 48 hours prior to first CHG shower. It is OK to shave your face.  Please follow these instructions carefully.     Shower the NIGHT BEFORE SURGERY and the [facility]  OF SURGERY with CHG Soap.   If you chose to wash your hair, wash your hair first as usual with your normal shampoo. After you shampoo, rinse your hair and body thoroughly to remove the shampoo.  Then [employer] and genitals (private parts) with your normal soap and rinse thoroughly to remove soap.  After that Use CHG Soap as you would any other liquid soap. You can apply CHG directly to the skin and wash gently with a scrungie or a clean washcloth.   Apply the CHG Soap to your body ONLY FROM THE NECK DOWN.  Do not use on open wounds or open sores. Avoid contact with  your eyes, ears, mouth and genitals (private parts). Wash Face and genitals (private parts)  with your normal soap.   Wash thoroughly, paying special attention to the area where your surgery will be performed.  Thoroughly rinse your body with warm water from the neck down.  DO NOT shower/wash with your normal soap after using and rinsing off the CHG Soap.  Pat yourself dry with a CLEAN TOWEL.  Wear CLEAN PAJAMAS to bed the night before surgery  Place CLEAN SHEETS on your bed the night before your surgery  DO NOT SLEEP WITH PETS.   Day of Surgery:  Take a shower with CHG soap. Wear Clean/Comfortable clothing the morning of surgery Do not apply any deodorants/lotions.   Remember to brush your teeth WITH YOUR REGULAR TOOTHPASTE.    If you received a COVID test during your pre-op visit, it is requested that you wear a mask when out in public, stay away from anyone that may not be feeling well, and notify your surgeon if you develop symptoms. If you have been in contact with anyone that has tested positive in the last 10 days, please notify your surgeon.    Please read over the following fact sheets that you were given.

## 2022-07-07 ENCOUNTER — Inpatient Hospital Stay (HOSPITAL_COMMUNITY)
Admission: RE | Admit: 2022-07-07 | Discharge: 2022-07-07 | Disposition: A | Discharge status: Home / Self Care | Payer: 59 | Source: Ambulatory Visit

## 2022-07-13 ENCOUNTER — Ambulatory Visit (HOSPITAL_COMMUNITY): Payer: 59 | Admitting: Certified Registered"

## 2022-07-13 ENCOUNTER — Other Ambulatory Visit: Payer: Self-pay

## 2022-07-13 ENCOUNTER — Encounter (HOSPITAL_COMMUNITY): Payer: Self-pay | Admitting: Surgery

## 2022-07-13 NOTE — Progress Notes (Signed)
Pre op call completed. Denies any medical history, CP, SOB, fever, cough. Not under the care of a cardiologist. Clear liquids until 4:30am, shower prior to arrival, no lotions, no deodorant, brush teeth. No NSAIDs or vitamins. Verbalized understanding

## 2022-07-14 ENCOUNTER — Encounter (HOSPITAL_COMMUNITY)
Admission: RE | Disposition: A | Discharge status: Home / Self Care | Payer: Self-pay | Source: Home / Self Care | Attending: Surgery

## 2022-07-14 ENCOUNTER — Other Ambulatory Visit: Payer: Self-pay

## 2022-07-14 ENCOUNTER — Ambulatory Visit (HOSPITAL_COMMUNITY)
Admission: RE | Admit: 2022-07-14 | Discharge: 2022-07-14 | Disposition: A | Discharge status: Home / Self Care | Payer: 59 | Attending: Surgery | Admitting: Surgery

## 2022-07-14 ENCOUNTER — Encounter (HOSPITAL_COMMUNITY): Payer: Self-pay | Admitting: Surgery

## 2022-07-14 DIAGNOSIS — Z539 Procedure and treatment not carried out, unspecified reason: Secondary | ICD-10-CM | POA: Insufficient documentation

## 2022-07-14 SURGERY — REPAIR, HERNIA, INGUINAL, ADULT
Anesthesia: General

## 2022-07-14 MED ORDER — MIDAZOLAM HCL 2 MG/2ML IJ SOLN
INTRAMUSCULAR | Status: AC
Start: 2022-07-14 — End: ?
  Filled 2022-07-14: qty 2

## 2022-07-14 MED ORDER — BUPIVACAINE-EPINEPHRINE (PF) 0.25% -1:200000 IJ SOLN
INTRAMUSCULAR | Status: AC
Start: 2022-07-14 — End: ?
  Filled 2022-07-14: qty 30

## 2022-07-14 MED ORDER — PROPOFOL 10 MG/ML IV BOLUS
INTRAVENOUS | Status: AC
  Filled 2022-07-14: qty 20

## 2022-07-14 MED ORDER — CHLORHEXIDINE GLUCONATE CLOTH 2 % EX PADS: 6.0000 | MEDICATED_PAD | Freq: Once | CUTANEOUS | Status: DC

## 2022-07-14 MED ORDER — FENTANYL CITRATE (PF) 250 MCG/5ML IJ SOLN
INTRAMUSCULAR | Status: AC
  Filled 2022-07-14: qty 5

## 2022-07-14 MED ORDER — CEFAZOLIN SODIUM-DEXTROSE 2-4 GM/100ML-% IV SOLN
INTRAVENOUS | Status: AC
  Filled 2022-07-14: qty 100

## 2022-07-14 MED ORDER — LACTATED RINGERS IV SOLN: INTRAVENOUS | Status: DC

## 2022-07-14 MED ORDER — CEFAZOLIN SODIUM-DEXTROSE 2-4 GM/100ML-% IV SOLN: 2.0000 g | INTRAVENOUS | Status: DC

## 2022-07-14 MED ORDER — ORAL CARE MOUTH RINSE: 15.0000 mL | Freq: Once | OROMUCOSAL | Status: AC

## 2022-07-14 MED ORDER — CHLORHEXIDINE GLUCONATE 0.12 % MT SOLN
OROMUCOSAL | Status: AC
  Administered 2022-07-14: 15 mL via OROMUCOSAL
  Filled 2022-07-14: qty 15

## 2022-07-14 MED ORDER — 0.9 % SODIUM CHLORIDE (POUR BTL) OPTIME: TOPICAL | Status: DC | PRN

## 2022-07-14 MED ORDER — BUPIVACAINE-EPINEPHRINE 0.25% -1:200000 IJ SOLN: INTRAMUSCULAR | Status: DC | PRN

## 2022-07-14 MED ORDER — CHLORHEXIDINE GLUCONATE 0.12 % MT SOLN: 15.0000 mL | Freq: Once | OROMUCOSAL | Status: AC

## 2022-07-14 NOTE — Progress Notes (Signed)
Pt. Surgery cancelled due to pt. Taking communion today. Dr. Luisa Hart not able to reschedule him today. Dr. Luisa Hart stated his office would get back in touch with pt. To reschedule the procedure. Pt. Verbalized understanding.

## 2022-07-14 NOTE — Interval H&P Note (Signed)
History and Physical Interval Note:  07/14/2022 7:18 AM  Thomas Holt  has presented today for surgery, with the diagnosis of RIGHT INGUINAL HERNIA.  The various methods of treatment have been discussed with the patient and family. After consideration of risks, benefits and other options for treatment, the patient has consented to  Procedure(s): OPEN BILATERAL INGUINAL HERNIA REPAIR WITH MESH (Bilateral) HERNIA REPAIR UMBILICAL ADULT-OPEN REPAIR WITH MESH (N/A) as a surgical intervention.  The patient's history has been reviewed, patient examined, no change in status, stable for surgery.  I have reviewed the patient's chart and labs.  Questions were answered to the patient's satisfaction.     Phoenix Riesen A Avynn Klassen

## 2022-07-14 NOTE — H&P (Signed)
Pt told anesthesiologist that he took communion at 4 am which included bread. Anesthesia approached me about this after the fact and said he would have to be delayed. Unfortunately, this will not work with schedule today and he will need to be rescheduled with the office. D/W pt the importance of strict NPO as is indicated on his paperwork to prevent possible aspiration and significant complication risk for elective procedure. Will have office reach out to him.

## 2022-07-14 NOTE — H&P (Signed)
History of Present Illness: Thomas Holt is a 51 y.o. male who is seen today for follow-up of right inguinal hernia. He was last seen in March 2023. He was scheduled to have his hernia repaired but was seen in the emergency room about 3 weeks ago for abdominal pain. He was found to have a small umbilical hernia as well as a small left inguinal hernia. He has a large right inguinal hernia. He comes in today for preoperative follow-up and discussion of repair of all of his hernias. He does have some issues with constipation indigestion he states. No nausea or vomiting. His bowels are moving. He is urinating..    Review of Systems: A complete review of systems was obtained from the patient. I have reviewed this information and discussed as appropriate with the patient. See HPI as well for other ROS.    Medical History: History reviewed. No pertinent past medical history.  There is no problem list on file for this patient.  History reviewed. No pertinent surgical history.   No Known Allergies  Current Outpatient Medications on File Prior to Visit  Medication Sig Dispense Refill  fluticasone propionate (FLONASE) 50 mcg/actuation nasal spray Place into one nostril   No current facility-administered medications on file prior to visit.   History reviewed. No pertinent family history.   Social History   Tobacco Use  Smoking Status Never  Smokeless Tobacco Never    Social History   Socioeconomic History  Marital status: Divorced  Tobacco Use  Smoking status: Never  Smokeless tobacco: Never   Objective:   There were no vitals filed for this visit.  There is no height or weight on file to calculate BMI.  Physical Exam Cardiovascular:  Rate and Rhythm: Normal rate.  Pulmonary:  Effort: Pulmonary effort is normal.  Breath sounds: No stridor.  Abdominal:  Tenderness: There is no abdominal tenderness.  Hernia: A hernia is present. Hernia is present in the umbilical area, left  inguinal area and right inguinal area.  Comments: All hernias is reducible  Neurological:  Mental Status: He is alert.    Labs, Imaging and Diagnostic Testing:  CLINICAL DATA: Hernia, complicated  EXAM: CT ABDOMEN AND PELVIS WITH CONTRAST  TECHNIQUE: Multidetector CT imaging of the abdomen and pelvis was performed using the standard protocol following bolus administration of intravenous contrast.  RADIATION DOSE REDUCTION: This exam was performed according to the departmental dose-optimization program which includes automated exposure control, adjustment of the mA and/or kV according to patient size and/or use of iterative reconstruction technique.  CONTRAST: OMNIPAQUE IOHEXOL 300 MG/ML SOLN  COMPARISON: CT abdomen pelvis 03/15/2022  FINDINGS: Lower chest: No acute abnormality.  Hepatobiliary: No focal liver abnormality is seen. Prior cholecystectomy with expected prominence of the intra and extrahepatic biliary ducts, similar to prior.  Pancreas: Unremarkable. No pancreatic ductal dilatation or surrounding inflammatory changes.  Spleen: Normal in size without focal abnormality.  Adrenals/Urinary Tract: Adrenal glands are unremarkable. No hydronephrosis or nephrolithiasis. There is some higher density material layering along the posterior bladder (series 5, images 99-104).  Stomach/Bowel: There are multiple dilated loops of small bowel, entering a right inguinal hernia, with focal small bowel fecalization within the hernia, and decompression at the hernia exit. There is mesenteric edema. Small bowel is decompressed distally. Prior appendectomy.  Vascular/Lymphatic: No significant vascular findings are present. No enlarged abdominal or pelvic lymph nodes.  Reproductive: Unremarkable.  Other: Right inguinal hernia as above with which contains a dilated fecalized loop of small bowel  and some fluid along the spermatic cord. Prior upper ventral hernia repair.  There is a small umbilical hernia containing fat and a single wall of small loop of small bowel, with prominent small bowel proximally and distally. Small fat containing left inguinal hernia.  Musculoskeletal: No acute osseous abnormality. No suspicious osseous lesions. Degenerative changes of the spine. 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  IMPRESSION: Small-bowel obstruction due to a right inguinal hernia.  Small umbilical hernia containing a single wall a loop of small bowel, with prominent small bowel proximally and distally, correlate with reducibility. Small fat containing left inguinal hernia.  Small amount of higher density material layering along the posterior bladder, recommend correlation with urinalysis.   Electronically Signed By: Caprice Renshaw M.D. On: 05/03/2022 08:44  Assessment and Plan:   Diagnoses and all orders for this visit:  Left inguinal hernia  Right inguinal hernia  Umbilical hernia without obstruction and without gangrene    No follow-ups on file.  Hayden Rasmussen, MD   I had direct face-to-face contact with the patient for a total of 25 minutes and greater than 50% of that time was spent providing counseling and/or coordination of care for the patient regarding hernia surgery .   Risk of hernia surgery bleeding, infection, recurrence, chronic pain organ injury anesthesia risk, blood clots stroke UTI pneuminia and rarely death

## 2022-07-29 NOTE — Pre-Procedure Instructions (Signed)
Surgical Instructions    Your procedure is scheduled on Thursday, September 14th.  Report to [facility] Main Entrance "A" at 09:30 A.M., then check in with the Admitting office.  Call this number if you have problems the morning of surgery:  [phone]   If you have any questions prior to your surgery date call [phone]: Open Monday-Friday 8am-4pm    Remember:  Do not eat after midnight the night before your surgery  You may drink clear liquids until 08:30 AM the morning of your surgery.   Clear liquids allowed are: Water, Non-Citrus Juices (without pulp), Carbonated Beverages, Clear Tea, Black Coffee Only (NO MILK, CREAM OR POWDERED CREAMER of any kind), and Gatorade.    Take these medicines the morning of surgery with A SIP OF WATER: none  As of today, STOP taking any Aspirin (unless otherwise instructed by your surgeon) Aleve, Naproxen, Ibuprofen, Motrin, Advil, Goody's, BC's, all herbal medications, fish oil, and all vitamins.                     Do NOT Smoke (Tobacco/Vaping) for 24 hours prior to your procedure.  If you use a CPAP at night, you may bring your mask/headgear for your overnight stay.   Contacts, glasses, piercing's, hearing aid's, dentures or partials may not be worn into surgery, please bring cases for these belongings.    For patients admitted to the hospital, discharge time will be determined by your treatment team.   Patients discharged the day of surgery will not be allowed to drive home, and someone needs to stay with them for 24 hours.  SURGICAL WAITING ROOM VISITATION Patients having surgery or a procedure may have no more than 2 support people in the waiting area - these visitors may rotate.   Children under the age of 44 must have an adult with them who is not the patient. If the patient needs to stay at the hospital during part of their recovery, the visitor guidelines for inpatient rooms apply. Pre-op nurse will coordinate an appropriate time  for 1 support person to accompany patient in pre-op.  This support person may not rotate.   Please refer to the [facility] website for the visitor guidelines for Inpatients (after your surgery is over and you are in a regular room).    Special instructions:   - Preparing For Surgery  Before surgery, you can play an important role. Because skin is not sterile, your skin needs to be as free of germs as possible. You can reduce the number of germs on your skin by washing with CHG (chlorahexidine gluconate) Soap before surgery.  CHG is an antiseptic cleaner which kills germs and bonds with the skin to continue killing germs even after washing.    Oral Hygiene is also important to reduce your risk of infection.  Remember - BRUSH YOUR TEETH THE MORNING OF SURGERY WITH YOUR REGULAR TOOTHPASTE  Please do not use if you have an allergy to CHG or antibacterial soaps. If your skin becomes reddened/irritated stop using the CHG.  Do not shave (including legs and underarms) for at least 48 hours prior to first CHG shower. It is OK to shave your face.  Please follow these instructions carefully.   Shower the NIGHT BEFORE SURGERY and the MORNING OF SURGERY  If you chose to wash your hair, wash your hair first as usual with your normal shampoo.  After you shampoo, rinse your hair and body thoroughly to remove the shampoo.  Use CHG Soap as you would any other liquid soap. You can apply CHG directly to the skin and wash gently with a scrungie or a clean washcloth.   Apply the CHG Soap to your body ONLY FROM THE NECK DOWN.  Do not use on open wounds or open sores. Avoid contact with your eyes, ears, mouth and genitals (private parts). Wash Face and genitals (private parts)  with your normal soap.   Wash thoroughly, paying special attention to the area where your surgery will be performed.  Thoroughly rinse your body with warm water from the neck down.  DO NOT shower/wash with your normal soap  after using and rinsing off the CHG Soap.  Pat yourself dry with a CLEAN TOWEL.  Wear CLEAN PAJAMAS to bed the night before surgery  Place CLEAN SHEETS on your bed the night before your surgery  DO NOT SLEEP WITH PETS.   Day of Surgery: Take a shower with CHG soap. Do not wear jewelry or makeup Do not wear lotions, powders, colognes, or deodorant. Men may shave face and neck. Do not bring valuables to the hospital. [facility] is not responsible for any belongings or valuables.  Wear Clean/Comfortable clothing the morning of surgery Remember to brush your teeth WITH YOUR REGULAR TOOTHPASTE.   Please read over the following fact sheets that you were given.    If you received a COVID test during your pre-op visit  it is requested that you wear a mask when out in public, stay away from anyone that may not be feeling well and notify your surgeon if you develop symptoms. If you have been in contact with anyone that has tested positive in the last 10 days please notify you surgeon.

## 2022-07-30 ENCOUNTER — Other Ambulatory Visit: Payer: Self-pay

## 2022-07-30 ENCOUNTER — Ambulatory Visit: Payer: Self-pay | Admitting: Surgery

## 2022-07-30 ENCOUNTER — Encounter (HOSPITAL_COMMUNITY): Payer: Self-pay

## 2022-07-30 ENCOUNTER — Encounter (HOSPITAL_COMMUNITY)
Admission: RE | Admit: 2022-07-30 | Discharge: 2022-07-30 | Disposition: A | Discharge status: Home / Self Care | Payer: Commercial Managed Care - HMO | Source: Ambulatory Visit | Attending: Surgery | Admitting: Surgery

## 2022-07-30 VITALS — BP 101/74 | HR 76 | Temp 98.0°F | Resp 17 | Ht 71.0 in | Wt 182.4 lb

## 2022-07-30 DIAGNOSIS — Z01812 Encounter for preprocedural laboratory examination: Secondary | ICD-10-CM | POA: Diagnosis present

## 2022-07-30 DIAGNOSIS — Z01818 Encounter for other preprocedural examination: Secondary | ICD-10-CM

## 2022-07-30 LAB — CBC
HCT: 46.6 % (ref 39.0–52.0)
Hemoglobin: 14.6 g/dL (ref 13.0–17.0)
MCH: 29.6 pg (ref 26.0–34.0)
MCHC: 31.3 g/dL (ref 30.0–36.0)
MCV: 94.5 fL (ref 80.0–100.0)
Platelets: 184 10*3/uL (ref 150–400)
RBC: 4.93 MIL/uL (ref 4.22–5.81)
RDW: 11.9 % (ref 11.5–15.5)
WBC: 7.1 10*3/uL (ref 4.0–10.5)
nRBC: 0 % (ref 0.0–0.2)

## 2022-07-30 NOTE — Progress Notes (Signed)
PCP - Dr. Shan Levans Cardiologist - denies  PPM/ICD - denies   Chest x-ray - 06/20/22 EKG - 06/22/22 Stress Test - denies ECHO - denies Cardiac Cath - denies  Sleep Study - denies   DM- denies  ASA/Blood Thinner Instructions: n/a   ERAS Protcol - no, NPO   COVID TEST- n/a   Anesthesia review: no  Patient denies shortness of breath, fever, cough and chest pain at PAT appointment   All instructions explained to the patient, with a verbal understanding of the material. Patient agrees to go over the instructions while at home for a better understanding.  The opportunity to ask questions was provided.

## 2022-08-05 ENCOUNTER — Ambulatory Visit: Payer: 59 | Admitting: Critical Care Medicine

## 2022-08-05 NOTE — Progress Notes (Signed)
Patient voiced understanding of new arrival time of 0700, clear liquids okay until 0600

## 2022-08-06 ENCOUNTER — Encounter (HOSPITAL_COMMUNITY)
Admission: RE | Disposition: A | Discharge status: Home / Self Care | Payer: Self-pay | Source: Home / Self Care | Attending: Surgery

## 2022-08-06 ENCOUNTER — Other Ambulatory Visit: Payer: Self-pay

## 2022-08-06 ENCOUNTER — Ambulatory Visit (HOSPITAL_BASED_OUTPATIENT_CLINIC_OR_DEPARTMENT_OTHER): Payer: Commercial Managed Care - HMO | Admitting: Critical Care Medicine

## 2022-08-06 ENCOUNTER — Ambulatory Visit (HOSPITAL_COMMUNITY)
Admission: RE | Admit: 2022-08-06 | Discharge: 2022-08-06 | Disposition: A | Discharge status: Home / Self Care | Payer: Commercial Managed Care - HMO | Attending: Surgery | Admitting: Surgery

## 2022-08-06 ENCOUNTER — Ambulatory Visit (HOSPITAL_COMMUNITY): Payer: Commercial Managed Care - HMO | Admitting: Critical Care Medicine

## 2022-08-06 DIAGNOSIS — K4020 Bilateral inguinal hernia, without obstruction or gangrene, not specified as recurrent: Secondary | ICD-10-CM | POA: Insufficient documentation

## 2022-08-06 DIAGNOSIS — K4090 Unilateral inguinal hernia, without obstruction or gangrene, not specified as recurrent: Secondary | ICD-10-CM

## 2022-08-06 DIAGNOSIS — K429 Umbilical hernia without obstruction or gangrene: Secondary | ICD-10-CM | POA: Insufficient documentation

## 2022-08-06 DIAGNOSIS — Z8719 Personal history of other diseases of the digestive system: Secondary | ICD-10-CM

## 2022-08-06 HISTORY — PX: UMBILICAL HERNIA REPAIR: SHX196

## 2022-08-06 HISTORY — PX: INSERTION OF MESH: SHX5868

## 2022-08-06 HISTORY — PX: INGUINAL HERNIA REPAIR: SHX194

## 2022-08-06 SURGERY — REPAIR, HERNIA, INGUINAL, BILATERAL, ADULT
Anesthesia: General | Site: Inguinal

## 2022-08-06 MED ORDER — OXYCODONE HCL 5 MG PO TABS: 5.0000 mg | ORAL_TABLET | Freq: Four times a day (QID) | ORAL | 0 refills | Status: DC | PRN

## 2022-08-06 MED ORDER — OXYCODONE HCL 5 MG PO TABS
ORAL_TABLET | ORAL | Status: AC
  Filled 2022-08-06: qty 1

## 2022-08-06 MED ORDER — CHLORHEXIDINE GLUCONATE 0.12 % MT SOLN
15.0000 mL | Freq: Once | OROMUCOSAL | Status: AC
  Administered 2022-08-06: 15 mL via OROMUCOSAL
  Filled 2022-08-06: qty 15

## 2022-08-06 MED ORDER — IBUPROFEN 800 MG PO TABS: 800.0000 mg | ORAL_TABLET | Freq: Three times a day (TID) | ORAL | 0 refills | Status: DC | PRN

## 2022-08-06 MED ORDER — ONDANSETRON HCL 4 MG/2ML IJ SOLN
INTRAMUSCULAR | Status: AC
  Filled 2022-08-06: qty 2

## 2022-08-06 MED ORDER — PROPOFOL 10 MG/ML IV BOLUS
INTRAVENOUS | Status: AC
  Filled 2022-08-06: qty 20

## 2022-08-06 MED ORDER — FENTANYL CITRATE (PF) 100 MCG/2ML IJ SOLN
25.0000 ug | INTRAMUSCULAR | Status: DC | PRN
  Administered 2022-08-06: 50 ug via INTRAVENOUS

## 2022-08-06 MED ORDER — LIDOCAINE 2% (20 MG/ML) 5 ML SYRINGE
INTRAMUSCULAR | Status: DC | PRN
  Administered 2022-08-06: 100 mg via INTRAVENOUS

## 2022-08-06 MED ORDER — LACTATED RINGERS IV SOLN: INTRAVENOUS | Status: DC

## 2022-08-06 MED ORDER — BUPIVACAINE LIPOSOME 1.3 % IJ SUSP
INTRAMUSCULAR | Status: DC | PRN
  Administered 2022-08-06: 20 mL

## 2022-08-06 MED ORDER — MIDAZOLAM HCL 2 MG/2ML IJ SOLN
INTRAMUSCULAR | Status: AC
  Filled 2022-08-06: qty 2

## 2022-08-06 MED ORDER — OXYCODONE HCL 5 MG PO TABS
5.0000 mg | ORAL_TABLET | Freq: Once | ORAL | Status: AC | PRN
  Administered 2022-08-06: 5 mg via ORAL

## 2022-08-06 MED ORDER — FENTANYL CITRATE (PF) 250 MCG/5ML IJ SOLN
INTRAMUSCULAR | Status: DC | PRN
  Administered 2022-08-06 (×2): 50 ug via INTRAVENOUS

## 2022-08-06 MED ORDER — OXYCODONE HCL 5 MG/5ML PO SOLN: 5.0000 mg | Freq: Once | ORAL | Status: AC | PRN

## 2022-08-06 MED ORDER — DEXAMETHASONE SODIUM PHOSPHATE 10 MG/ML IJ SOLN
INTRAMUSCULAR | Status: DC | PRN
  Administered 2022-08-06: 4 mg via INTRAVENOUS

## 2022-08-06 MED ORDER — ROCURONIUM BROMIDE 10 MG/ML (PF) SYRINGE
PREFILLED_SYRINGE | INTRAVENOUS | Status: DC | PRN
  Administered 2022-08-06: 10 mg via INTRAVENOUS
  Administered 2022-08-06: 70 mg via INTRAVENOUS
  Administered 2022-08-06: 20 mg via INTRAVENOUS

## 2022-08-06 MED ORDER — ONDANSETRON HCL 4 MG/2ML IJ SOLN: 4.0000 mg | Freq: Once | INTRAMUSCULAR | Status: DC | PRN

## 2022-08-06 MED ORDER — CHLORHEXIDINE GLUCONATE CLOTH 2 % EX PADS
6.0000 | MEDICATED_PAD | Freq: Once | CUTANEOUS | Status: AC
  Administered 2022-08-06: 6 via TOPICAL

## 2022-08-06 MED ORDER — SUGAMMADEX SODIUM 200 MG/2ML IV SOLN
INTRAVENOUS | Status: DC | PRN
  Administered 2022-08-06: 200 mg via INTRAVENOUS

## 2022-08-06 MED ORDER — ACETAMINOPHEN 500 MG PO TABS
1000.0000 mg | ORAL_TABLET | Freq: Once | ORAL | Status: AC
Start: 2022-08-06 — End: 2022-08-06
  Administered 2022-08-06: 1000 mg via ORAL
  Filled 2022-08-06: qty 2

## 2022-08-06 MED ORDER — 0.9 % SODIUM CHLORIDE (POUR BTL) OPTIME
TOPICAL | Status: DC | PRN
  Administered 2022-08-06: 1000 mL

## 2022-08-06 MED ORDER — PHENYLEPHRINE 80 MCG/ML (10ML) SYRINGE FOR IV PUSH (FOR BLOOD PRESSURE SUPPORT)
PREFILLED_SYRINGE | INTRAVENOUS | Status: DC | PRN
  Administered 2022-08-06 (×3): 80 ug via INTRAVENOUS

## 2022-08-06 MED ORDER — FENTANYL CITRATE (PF) 250 MCG/5ML IJ SOLN
INTRAMUSCULAR | Status: AC
  Filled 2022-08-06: qty 5

## 2022-08-06 MED ORDER — LIDOCAINE 2% (20 MG/ML) 5 ML SYRINGE
INTRAMUSCULAR | Status: AC
  Filled 2022-08-06: qty 5

## 2022-08-06 MED ORDER — METHOCARBAMOL 750 MG PO TABS: 750.0000 mg | ORAL_TABLET | Freq: Three times a day (TID) | ORAL | 0 refills | Status: DC | PRN

## 2022-08-06 MED ORDER — CHLORHEXIDINE GLUCONATE CLOTH 2 % EX PADS: 6.0000 | MEDICATED_PAD | Freq: Once | CUTANEOUS | Status: DC

## 2022-08-06 MED ORDER — ONDANSETRON HCL 4 MG/2ML IJ SOLN
INTRAMUSCULAR | Status: DC | PRN
  Administered 2022-08-06: 4 mg via INTRAVENOUS

## 2022-08-06 MED ORDER — FENTANYL CITRATE (PF) 100 MCG/2ML IJ SOLN
INTRAMUSCULAR | Status: AC
  Filled 2022-08-06: qty 2

## 2022-08-06 MED ORDER — BUPIVACAINE LIPOSOME 1.3 % IJ SUSP
INTRAMUSCULAR | Status: AC
  Filled 2022-08-06: qty 20

## 2022-08-06 MED ORDER — BUPIVACAINE-EPINEPHRINE 0.25% -1:200000 IJ SOLN
INTRAMUSCULAR | Status: DC | PRN
  Administered 2022-08-06: 30 mL

## 2022-08-06 MED ORDER — BUPIVACAINE-EPINEPHRINE (PF) 0.25% -1:200000 IJ SOLN
INTRAMUSCULAR | Status: AC
  Filled 2022-08-06: qty 30

## 2022-08-06 MED ORDER — SODIUM CHLORIDE (PF) 0.9 % IJ SOLN
INTRAMUSCULAR | Status: DC | PRN
  Administered 2022-08-06 (×2): 10 mL

## 2022-08-06 MED ORDER — PROPOFOL 10 MG/ML IV BOLUS
INTRAVENOUS | Status: DC | PRN
  Administered 2022-08-06: 150 mg via INTRAVENOUS

## 2022-08-06 MED ORDER — CEFAZOLIN SODIUM-DEXTROSE 2-4 GM/100ML-% IV SOLN
2.0000 g | INTRAVENOUS | Status: AC
  Administered 2022-08-06: 2 g via INTRAVENOUS
  Filled 2022-08-06: qty 100

## 2022-08-06 MED ORDER — ORAL CARE MOUTH RINSE: 15.0000 mL | Freq: Once | OROMUCOSAL | Status: AC

## 2022-08-06 MED ORDER — MIDAZOLAM HCL 2 MG/2ML IJ SOLN
INTRAMUSCULAR | Status: DC | PRN
  Administered 2022-08-06: 2 mg via INTRAVENOUS

## 2022-08-06 MED ORDER — AMISULPRIDE (ANTIEMETIC) 5 MG/2ML IV SOLN
10.0000 mg | Freq: Once | INTRAVENOUS | Status: DC | PRN
Start: 2022-08-06 — End: 2022-08-07

## 2022-08-06 MED ORDER — EPHEDRINE SULFATE-NACL 50-0.9 MG/10ML-% IV SOSY
PREFILLED_SYRINGE | INTRAVENOUS | Status: DC | PRN
  Administered 2022-08-06 (×3): 5 mg via INTRAVENOUS

## 2022-08-06 SURGICAL SUPPLY — 42 items
BAG COUNTER SPONGE SURGICOUNT (BAG) ×3 IMPLANT
BLADE CLIPPER SURG (BLADE) ×1 IMPLANT
CANISTER SUCT 3000ML PPV (MISCELLANEOUS) ×1 IMPLANT
CHLORAPREP W/TINT 26 (MISCELLANEOUS) ×3 IMPLANT
COVER SURGICAL LIGHT HANDLE (MISCELLANEOUS) ×3 IMPLANT
DERMABOND ADVANCE ×1 IMPLANT
DERMABOND ADVANCED .7 DNX12 (GAUZE/BANDAGES/DRESSINGS) ×5 IMPLANT
DRAIN PENROSE 1/2X12 LTX STRL (WOUND CARE) ×1 IMPLANT
DRAPE LAPAROTOMY 100X72X124 (DRAPES) ×1 IMPLANT
DRAPE LAPAROTOMY TRNSV 102X78 (DRAPES) ×3 IMPLANT
ELECT REM PT RETURN 9FT ADLT (ELECTROSURGICAL) ×3
ELECTRODE REM PT RTRN 9FT ADLT (ELECTROSURGICAL) ×3 IMPLANT
GLOVE BIO SURGEON STRL SZ8 (GLOVE) ×3 IMPLANT
GLOVE BIOGEL PI IND STRL 8 (GLOVE) ×3 IMPLANT
GOWN STRL REUS W/ TWL LRG LVL3 (GOWN DISPOSABLE) ×3 IMPLANT
GOWN STRL REUS W/ TWL XL LVL3 (GOWN DISPOSABLE) ×3 IMPLANT
GOWN STRL REUS W/TWL LRG LVL3 (GOWN DISPOSABLE) ×3
GOWN STRL REUS W/TWL XL LVL3 (GOWN DISPOSABLE) ×3
KIT BASIN OR (CUSTOM PROCEDURE TRAY) ×3 IMPLANT
KIT TURNOVER KIT B (KITS) ×3 IMPLANT
MESH HERNIA SYS ULTRAPRO LRG (Mesh General) ×2 IMPLANT
NDL HYPO 25GX1X1/2 BEV (NEEDLE) ×2 IMPLANT
NEEDLE HYPO 22GX1.5 SAFETY (NEEDLE) ×1 IMPLANT
NEEDLE HYPO 25GX1X1/2 BEV (NEEDLE) ×3 IMPLANT
NS IRRIG 1000ML POUR BTL (IV SOLUTION) ×3 IMPLANT
PACK GENERAL/GYN (CUSTOM PROCEDURE TRAY) ×3 IMPLANT
PAD ARMBOARD 7.5X6 YLW CONV (MISCELLANEOUS) ×3 IMPLANT
PENCIL SMOKE EVACUATOR (MISCELLANEOUS) ×3 IMPLANT
SUT MNCRL AB 4-0 PS2 18 (SUTURE) ×4 IMPLANT
SUT MON AB 4-0 PC3 18 (SUTURE) ×3 IMPLANT
SUT NOVA NAB DX-16 0-1 5-0 T12 (SUTURE) ×3 IMPLANT
SUT NOVA NAB GS-21 1 T12 (SUTURE) ×7 IMPLANT
SUT VIC AB 2-0 SH 27 (SUTURE) ×6
SUT VIC AB 2-0 SH 27X BRD (SUTURE) ×3 IMPLANT
SUT VIC AB 2-0 SH 27XBRD (SUTURE) ×1 IMPLANT
SUT VIC AB 3-0 SH 18 (SUTURE) ×2 IMPLANT
SUT VIC AB 3-0 SH 27 (SUTURE) ×3
SUT VIC AB 3-0 SH 27X BRD (SUTURE) ×3 IMPLANT
SUT VICRYL AB 3 0 TIES (SUTURE) ×3 IMPLANT
SYR CONTROL 10ML LL (SYRINGE) ×4 IMPLANT
TOWEL GREEN STERILE (TOWEL DISPOSABLE) ×3 IMPLANT
TOWEL GREEN STERILE FF (TOWEL DISPOSABLE) ×3 IMPLANT

## 2022-08-06 NOTE — Anesthesia Postprocedure Evaluation (Signed)
Anesthesia Post Note  Patient: Thomas Holt  Procedure(s) Performed: OPEN BILATERAL INGUINAL HERNIA REPAIR WITH MESH (Bilateral: Inguinal) OPEN UMBILICAL HERNIA REPAIR (Abdomen) INSERTION OF MESH (Bilateral: Inguinal)     Patient location during evaluation: PACU Anesthesia Type: General Level of consciousness: awake and alert Pain management: pain level controlled Vital Signs Assessment: post-procedure vital signs reviewed and stable Respiratory status: spontaneous breathing, nonlabored ventilation and respiratory function stable Cardiovascular status: blood pressure returned to baseline and stable Postop Assessment: no apparent nausea or vomiting Anesthetic complications: no   No notable events documented.  Last Vitals:  Vitals:   08/06/22 1330 08/06/22 1345  BP: 131/66 130/68  Pulse: 68 70  Resp: 20 20  Temp:  36.5 C  SpO2: 97% 96%    Last Pain:  Vitals:   08/06/22 1245  PainSc: 5                  Laverle Pillard E Orlondo [place]

## 2022-08-06 NOTE — Discharge Instructions (Signed)
[facility] ______[facility] LAPAROSCOP[facility] SUR[facility]ERY: POST OP INSTRUCTIONS Always review your discharge instruction sheet given to you by the facility where your surgery was performed. IF YOU HAVE DISABILITY OR FAMILY LEAVE FORMS, YOU MUST BRING THEM TO THE OFFICE FOR PROCESSING.   DO NOT GIVE THEM TO YOUR DOCTOR.  A prescription for pain medication may be given to you upon discharge.  Take your pain medication as prescribed, if needed.  If narcotic pain medicine is not needed, then you may take acetaminophen (Tylenol) or ibuprofen (Advil) as needed. Take your usually prescribed medications unless otherwise directed. If you need a refill on your pain medication, please contact your pharmacy.  They will contact our office to request authorization. Prescriptions will not be filled after 5pm or on week-ends. You should follow a light diet the first few days after arrival home, such as soup and crackers, etc.  Be sure to include lots of fluids daily. Most patients will experience some swelling and bruising in the area of the incisions.  Ice packs will help.  Swelling and bruising can take several days to resolve.  It is common to experience some constipation if taking pain medication after surgery.  Increasing fluid intake and taking a stool softener (such as Colace) will usually help or prevent this problem from occurring.  A mild laxative (Milk of Magnesia or Miralax) should be taken according to package instructions if there are no bowel movements after 48 hours. Unless discharge instructions indicate otherwise, you may remove your bandages 24-48 hours after surgery, and you may shower at that time.  You may have steri-strips (small skin tapes) in place directly over the incision.  These strips should be left on the skin for 7-10 days.  If your surgeon used skin glue on the incision, you may shower in 24 hours.  The glue will flake off over the next 2-3 weeks.  Any sutures or staples will be  removed at the office during your follow-up visit. ACTIVITIES:  You may resume regular (light) daily activities beginning the next day--such as daily self-care, walking, climbing stairs--gradually increasing activities as tolerated.  You may have sexual intercourse when it is comfortable.  Refrain from any heavy lifting or straining until approved by your doctor. You may drive when you are no longer taking prescription pain medication, you can comfortably wear a seatbelt, and you can safely maneuver your car and apply brakes. RETURN TO WORK:  __________________________________________________________ You should see your doctor in the office for a follow-up appointment approximately 2-3 weeks after your surgery.  Make sure that you call for this appointment within a day or two after you arrive home to insure a convenient appointment time. OTHER INSTRUCTIONS: __________________________________________________________________________________________________________________________ __________________________________________________________________________________________________________________________ WHEN TO CALL YOUR DOCTOR: Fever over 101.0 Inability to urinate Continued bleeding from incision. Increased pain, redness, or drainage from the incision. Increasing abdominal pain  The clinic staff is available to answer your questions during regular business hours.  Please don't hesitate to call and ask to speak to one of the nurses for clinical concerns.  If you have a medical emergency, go to the nearest emergency room or call 911.  A surgeon from [facility] is always on call at the hospital. [address], [address], [place], [place]  [address] ? [address] [address], [place], [place]   [address] [phone] ? [phone] ? FAX [phone] Web site: [email or web address]  

## 2022-08-06 NOTE — H&P (Signed)
History of Present Illness: Thomas Holt is a 51 y.o. male who is seen today for follow-up of right inguinal hernia. He was last seen in March 2023. He was scheduled to have his hernia repaired but was seen in the emergency room about 3 weeks ago for abdominal pain. He was found to have a small umbilical hernia as well as a small left inguinal hernia. He has a large right inguinal hernia. He comes in today for preoperative follow-up and discussion of repair of all of his hernias. He does have some issues with constipation indigestion he states. No nausea or vomiting. His bowels are moving. He is urinating..    Review of Systems: A complete review of systems was obtained from the patient. I have reviewed this information and discussed as appropriate with the patient. See HPI as well for other ROS.    Medical History: History reviewed. No pertinent past medical history.  There is no problem list on file for this patient.  History reviewed. No pertinent surgical history.   No Known Allergies  Current Outpatient Medications on File Prior to Visit  Medication Sig Dispense Refill  fluticasone propionate (FLONASE) 50 mcg/actuation nasal spray Place into one nostril   No current facility-administered medications on file prior to visit.   History reviewed. No pertinent family history.   Social History   Tobacco Use  Smoking Status Never  Smokeless Tobacco Never    Social History   Socioeconomic History  Marital status: Divorced  Tobacco Use  Smoking status: Never  Smokeless tobacco: Never   Objective:   There were no vitals filed for this visit.  There is no height or weight on file to calculate BMI.  Physical Exam Cardiovascular:  Rate and Rhythm: Normal rate.  Pulmonary:  Effort: Pulmonary effort is normal.  Breath sounds: No stridor.  Abdominal:  Tenderness: There is no abdominal tenderness.  Hernia: A hernia is present. Hernia is present in the umbilical area, left  inguinal area and right inguinal area.  Comments: All hernias is reducible  Neurological:  Mental Status: He is alert.    Labs, Imaging and Diagnostic Testing:  CLINICAL DATA: Hernia, complicated  EXAM: CT ABDOMEN AND PELVIS WITH CONTRAST  TECHNIQUE: Multidetector CT imaging of the abdomen and pelvis was performed using the standard protocol following bolus administration of intravenous contrast.  RADIATION DOSE REDUCTION: This exam was performed according to the departmental dose-optimization program which includes automated exposure control, adjustment of the mA and/or kV according to patient size and/or use of iterative reconstruction technique.  CONTRAST: OMNIPAQUE IOHEXOL 300 MG/ML SOLN  COMPARISON: CT abdomen pelvis 03/15/2022  FINDINGS: Lower chest: No acute abnormality.  Hepatobiliary: No focal liver abnormality is seen. Prior cholecystectomy with expected prominence of the intra and extrahepatic biliary ducts, similar to prior.  Pancreas: Unremarkable. No pancreatic ductal dilatation or surrounding inflammatory changes.  Spleen: Normal in size without focal abnormality.  Adrenals/Urinary Tract: Adrenal glands are unremarkable. No hydronephrosis or nephrolithiasis. There is some higher density material layering along the posterior bladder (series 5, images 99-104).  Stomach/Bowel: There are multiple dilated loops of small bowel, entering a right inguinal hernia, with focal small bowel fecalization within the hernia, and decompression at the hernia exit. There is mesenteric edema. Small bowel is decompressed distally. Prior appendectomy.  Vascular/Lymphatic: No significant vascular findings are present. No enlarged abdominal or pelvic lymph nodes.  Reproductive: Unremarkable.  Other: Right inguinal hernia as above with which contains a dilated fecalized loop of small bowel  and some fluid along the spermatic cord. Prior upper ventral hernia repair.  There is a small umbilical hernia containing fat and a single wall of small loop of small bowel, with prominent small bowel proximally and distally. Small fat containing left inguinal hernia.  Musculoskeletal: No acute osseous abnormality. No suspicious osseous lesions. Degenerative changes of the spine. 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  IMPRESSION: Small-bowel obstruction due to a right inguinal hernia.  Small umbilical hernia containing a single wall a loop of small bowel, with prominent small bowel proximally and distally, correlate with reducibility. Small fat containing left inguinal hernia.  Small amount of higher density material layering along the posterior bladder, recommend correlation with urinalysis.   Electronically Signed By: Caprice Renshaw M.D. On: 05/03/2022 08:44  Assessment and Plan:   Diagnoses and all orders for this visit:  Left inguinal hernia  Right inguinal hernia  Umbilical hernia without obstruction and without gangrene    No follow-ups on file.  Hayden Rasmussen, MD

## 2022-08-06 NOTE — Op Note (Signed)
Preoperative diagnosis: Bilateral inguinal hernia and umbilical hernia  Postoperative diagnosis: Same  Procedure: Repair of bilateral inguinal hernia with mesh and repair of umbilical hernia primary  Surgeon: Erroll Luna, MD  Anesthesia: General with 0.25% Marcaine plain and Exparel real bilateral TA P blocks utilizing 20 cc per side  EBL: 10 cc  Specimen: None  Drains: None  Indications for procedure: The patient is a 51 year old male who has bilateral hernias and a small umbilical hernia.  We discussed repair options of laparoscopic open and use of mesh.  He opted to undergo open repair of both of his hernias as well as umbilical hernia with mesh.The risk of hernia repair include bleeding,  Infection,   Recurrence of the hernia,  Mesh use, chronic pain,  Organ injury,  Bowel injury,  Bladder injury,   nerve injury with numbness around the incision,  Death,  and worsening of preexisting  medical problems.  The alternatives to surgery have been discussed as well..  Long term expectations of both operative and non operative treatments have been discussed.   The patient agrees to proceed.     Description of procedure: The patient was met in the holding area and questions were answered.  He was then taken back to the operating room placed upon upon the OR table.  After induction of general esthesia, the abdomen was prepped and draped in sterile fashion and timeout performed.  The left side was addressed first.  Local anesthetic of 0.25% Marcaine with epinephrine was infiltrated under the left inguinal crease skin.  Incision was made.  Dissection was carried down through the subcutaneous tissues to the aponeurosis of the external oblique was identified.  We injected 20 cc of Exparel mixed with 10 cc Exparel per 10 cc of saline.  This was done to create the block.  We then opened the aponeurosis of the external bleak.  The cord structures were encircled.  He had a direct hernia.  The cord was  examined and there is no evidence of indirect hernia.  The ultra pro hernia system was used.  This was placed into the preperitoneal space through the direct hernia defect.  It was then deployed.  It was secured to the shelving edge of the inguinal ligament, pubic tubercle and conjoined tendon with #1 Novafil.  A slit was cut for cord structures to exit.  This was closed around the mesh carefully allowing ample room for the cord exit but no constriction was noted.  Once the mesh was secured, the ilioinguinal nerve identified and divided as it is the muscle to prevent entrapment.  Hemostasis was achieved.  The aponeurosis of the external bleak was closed with 2-0 Vicryl.  3-0 Vicryl was used to approximate Scarpa's fascia and 4 Monocryl was used to close the skin in a subcuticular fashion.  The umbilical hernia was done next.  Local anesthetic of 0.25% Marcaine was infiltrated around the umbilical.  Curvilinear incision was made.  Dissection was carried down and the umbilical hernia sac was dissected out circumferentially.  The defect measured about a centimeter and this was reduced back into the preperitoneal space.  I opted to close the defect primarily with #1 Novafil.  The subcutaneous tissues were approximated with 2-0 Vicryl.  3-0 Vicryl was used to help approximate skin edges a 4 Monocryl was used to close skin in a subcuticular fashion.  The right inguinal hernia was done next.  In similar fashion local anesthetic was infiltrated to the skin.  An oblique incision  was made in the right inguinal crease.  Dissection was carried down through Scarpa's fascia until the aponeurosis of the external bleak was identified.  In a similar fashion Exparel was injected for the block on the side.  The fibers were then opened with Metzenbaum scissors through the external ring.  He had a much larger indirect hernia on this side that extended down into the right scrotum.  We able to dissect the sac out of the scrotum and  reduces back output but was dense adherent to the cord structures.  We tediously dissected the hernia sac off the cord.  The cord was carefully protected.  I opened the sac to examine for contents and there was only peritoneum noted.  No evidence of any internal viscera were in the sac.  I then oversewed the sac with 2-0 Vicryl and reduced it back into the preperitoneal space.  There is also a cord lipoma that was reduced back into the preperitoneal space.  The ultra pro hernia system was used with a or leaflet placed in the preperitoneal space.  It was secured to the shelving edge of the inguinal ligament, pubic tubercle and conjoined tendon with #1 Novafil.  A small slit was cut for the cord structures and this was closed around this as well.  There is no undue tension on the cord structures.  Hemostasis was excellent.  In a similar fashion the right ilioinguinal nerve was divided to prevent entrapment and postop pain.  This was done to the level of muscle.  We then closed the aponeurosis of the external bleak with 2-0 Vicryl.  3-0 Vicryl was used to approximate Scarpa's fascia and 4 Monocryl was used to close skin in a subcuticular fashion.  Dermabond was applied to all incisions.  All counts were found to be correct.  The patient was awoke extubated taken to recovery in satisfactory condition.

## 2022-08-06 NOTE — Progress Notes (Signed)
Patient is fully recovered. Waiting in PACU 16 for ride who will be here at 8pm.  Hermina Barters, RN

## 2022-08-06 NOTE — Anesthesia Preprocedure Evaluation (Signed)
Anesthesia Evaluation  Patient identified by MRN, date of birth, ID band Patient awake    Reviewed: Allergy & Precautions, NPO status , Patient's Chart, lab work & pertinent test results  History of Anesthesia Complications Negative for: history of anesthetic complications  Airway Mallampati: II  TM Distance: >3 FB Neck ROM: Full    Dental  (+) Teeth Intact, Dental Advisory Given   Pulmonary neg pulmonary ROS,    Pulmonary exam normal        Cardiovascular negative cardio ROS Normal cardiovascular exam     Neuro/Psych negative neurological ROS     GI/Hepatic negative GI ROS, Neg liver ROS,   Endo/Other  negative endocrine ROS  Renal/GU negative Renal ROS  negative genitourinary   Musculoskeletal negative musculoskeletal ROS (+)   Abdominal   Peds  Hematology negative hematology ROS (+)   Anesthesia Other Findings Inguinal hernias, umbilical hernia  Reproductive/Obstetrics                            Anesthesia Physical Anesthesia Plan  ASA: 2  Anesthesia Plan: General   Post-op Pain Management: Tylenol PO (pre-op)* and Toradol IV (intra-op)*   Induction: Intravenous  PONV Risk Score and Plan: 3 and Ondansetron, Dexamethasone, Treatment may vary due to age or medical condition and Midazolam  Airway Management Planned: Oral ETT  Additional Equipment: None  Intra-op Plan:   Post-operative Plan: Extubation in OR  Informed Consent: I have reviewed the patients History and Physical, chart, labs and discussed the procedure including the risks, benefits and alternatives for the proposed anesthesia with the patient or authorized representative who has indicated his/her understanding and acceptance.     Dental advisory given  Plan Discussed with:   Anesthesia Plan Comments:         Anesthesia Quick Evaluation

## 2022-08-06 NOTE — Anesthesia Procedure Notes (Signed)
Procedure Name: Intubation Date/Time: 08/06/2022 10:04 AM  Performed by: Sammie Bench, CRNAPre-anesthesia Checklist: Patient identified, Emergency Drugs available, Suction available and Patient being monitored Patient Re-evaluated:Patient Re-evaluated prior to induction Oxygen Delivery Method: Circle System Utilized Preoxygenation: Pre-oxygenation with 100% oxygen Induction Type: IV induction Ventilation: Mask ventilation without difficulty Laryngoscope Size: Mac and 3 Grade View: Grade III Tube type: Oral Tube size: 8.0 mm Number of attempts: 1 Airway Equipment and Method: Stylet and Oral airway Placement Confirmation: ETT inserted through vocal cords under direct vision, positive ETCO2 and breath sounds checked- equal and bilateral Secured at: 23 cm Tube secured with: Tape Dental Injury: Teeth and Oropharynx as per pre-operative assessment

## 2022-08-06 NOTE — Transfer of Care (Signed)
Immediate Anesthesia Transfer of Care Note  Patient: Thomas Holt  Procedure(s) Performed: OPEN BILATERAL INGUINAL HERNIA REPAIR WITH MESH (Bilateral: Inguinal) OPEN UMBILICAL HERNIA REPAIR (Abdomen) INSERTION OF MESH (Bilateral: Inguinal)  Patient Location: PACU  Anesthesia Type:General  Level of Consciousness: awake, alert , oriented and patient cooperative  Airway & Oxygen Therapy: Patient Spontanous Breathing  Post-op Assessment: Report given to RN and Post -op Vital signs reviewed and stable  Post vital signs: Reviewed and stable  Last Vitals:  Vitals Value Taken Time  BP 147/73 08/06/22 1246  Temp    Pulse 88 08/06/22 1249  Resp 24 08/06/22 1248  SpO2 97 % 08/06/22 1249  Vitals shown include unvalidated device data.  Last Pain:  Vitals:   08/06/22 0934  PainSc: 0-No pain         Complications: No notable events documented.

## 2022-08-06 NOTE — Interval H&P Note (Signed)
History and Physical Interval Note:  08/06/2022 9:52 AM  Thomas Holt  has presented today for surgery, with the diagnosis of RIGHT INGUINAL HERNIA.  The various methods of treatment have been discussed with the patient and family. After consideration of risks, benefits and other options for treatment, the patient has consented to  Procedure(s): OPEN BILATERAL INGUINAL HERNIA REPAIR WITH MESH (Bilateral) HERNIA REPAIR UMBILICAL ADULT-OPEN REPAIR WITH MESH (N/A) as a surgical intervention.  The patient's history has been reviewed, patient examined, no change in status, stable for surgery.  I have reviewed the patient's chart and labs.  Questions were answered to the patient's satisfaction.    The risk of hernia repair include bleeding,  Infection,   Recurrence of the hernia,  Mesh use, chronic pain,  Organ injury,  Bowel injury,  Bladder injury,   nerve injury with numbness around the incision,  Death,  and worsening of preexisting  medical problems.  The alternatives to surgery have been discussed as well..  Long term expectations of both operative and non operative treatments have been discussed.   The patient agrees to proceed.  [name] A Shatasha Lambing

## 2022-08-07 ENCOUNTER — Encounter (HOSPITAL_COMMUNITY): Payer: Self-pay | Admitting: Surgery

## 2022-09-02 ENCOUNTER — Ambulatory Visit: Payer: 59 | Admitting: Physician Assistant

## 2022-09-21 ENCOUNTER — Encounter (HOSPITAL_COMMUNITY): Payer: Self-pay

## 2022-09-21 ENCOUNTER — Other Ambulatory Visit: Payer: Self-pay

## 2022-09-21 ENCOUNTER — Emergency Department (HOSPITAL_COMMUNITY)
Admission: EM | Admit: 2022-09-21 | Discharge: 2022-09-22 | Disposition: A | Discharge status: Home / Self Care | Payer: Commercial Managed Care - HMO | Attending: Emergency Medicine | Admitting: Emergency Medicine

## 2022-09-21 DIAGNOSIS — R5383 Other fatigue: Secondary | ICD-10-CM | POA: Insufficient documentation

## 2022-09-21 DIAGNOSIS — R531 Weakness: Secondary | ICD-10-CM | POA: Insufficient documentation

## 2022-09-21 LAB — URINALYSIS, ROUTINE W REFLEX MICROSCOPIC
Bilirubin Urine: NEGATIVE
Glucose, UA: NEGATIVE mg/dL
Hgb urine dipstick: NEGATIVE
Ketones, ur: NEGATIVE mg/dL
Leukocytes,Ua: NEGATIVE
Nitrite: NEGATIVE
Protein, ur: NEGATIVE mg/dL
Specific Gravity, Urine: 1.026 (ref 1.005–1.030)
pH: 7 (ref 5.0–8.0)

## 2022-09-21 LAB — CBC WITH DIFFERENTIAL/PLATELET
Abs Immature Granulocytes: 0.02 10*3/uL (ref 0.00–0.07)
Basophils Absolute: 0 10*3/uL (ref 0.0–0.1)
Basophils Relative: 0 %
Eosinophils Absolute: 0 10*3/uL (ref 0.0–0.5)
Eosinophils Relative: 0 %
HCT: 45.6 % (ref 39.0–52.0)
Hemoglobin: 14.5 g/dL (ref 13.0–17.0)
Immature Granulocytes: 0 %
Lymphocytes Relative: 24 %
Lymphs Abs: 1.9 10*3/uL (ref 0.7–4.0)
MCH: 30.2 pg (ref 26.0–34.0)
MCHC: 31.8 g/dL (ref 30.0–36.0)
MCV: 95 fL (ref 80.0–100.0)
Monocytes Absolute: 0.4 10*3/uL (ref 0.1–1.0)
Monocytes Relative: 5 %
Neutro Abs: 5.7 10*3/uL (ref 1.7–7.7)
Neutrophils Relative %: 71 %
Platelets: 214 10*3/uL (ref 150–400)
RBC: 4.8 MIL/uL (ref 4.22–5.81)
RDW: 11.7 % (ref 11.5–15.5)
WBC: 8.1 10*3/uL (ref 4.0–10.5)
nRBC: 0 % (ref 0.0–0.2)

## 2022-09-21 LAB — BASIC METABOLIC PANEL
Anion gap: 11 (ref 5–15)
BUN: 12 mg/dL (ref 6–20)
CO2: 26 mmol/L (ref 22–32)
Calcium: 9.4 mg/dL (ref 8.9–10.3)
Chloride: 105 mmol/L (ref 98–111)
Creatinine, Ser: 1.06 mg/dL (ref 0.61–1.24)
GFR, Estimated: >60 mL/min (ref >60)
Glucose, Bld: 113 mg/dL — ABNORMAL HIGH (ref 70–99)
Potassium: 4.1 mmol/L (ref 3.5–5.1)
Sodium: 142 mmol/L (ref 135–145)

## 2022-09-21 NOTE — ED Triage Notes (Signed)
Reports was feeling weak earlier.  When asked if any other symptoms patient denies.  Patient states "I think I'm dehydrated bc I havent been drinking a lot of water".

## 2022-09-21 NOTE — ED Provider Triage Note (Signed)
Emergency Medicine Provider Triage Evaluation Note  Heather Streeper , a 51 y.o. male  was evaluated in triage.  Pt complains of fatigue.  Reports feeling better now.  Review of Systems  Positive:  Negative: Melena, tachycardia, dizziness  Physical Exam  BP 120/77 (BP Location: Left Arm)   Pulse 75   Temp 98.3 F (36.8 C)   Resp 16   Ht 5\' 11"  (1.803 m)   Wt 83.5 kg   SpO2 97%   BMI 25.66 kg/m  Gen:   Awake, no distress   Resp:  Normal effort  MSK:   Moves extremities without difficulty  Other:    Medical Decision Making  Medically screening exam initiated at 3:29 PM.  Appropriate orders placed.  Glenden Rossell was informed that the remainder of the evaluation will be completed by another provider, this initial triage assessment does not replace that evaluation, and the importance of remaining in the ED until their evaluation is complete.     Rhae Hammock, PA-C 09/21/22 1529

## 2022-09-22 NOTE — Discharge Instructions (Addendum)
Placed plenty of fluids.  Follow-up with your primary this week for reevaluation if your symptoms return.  Return to the ED if things change or worsen.

## 2022-09-22 NOTE — ED Notes (Signed)
Patient Alert and oriented to baseline. Stable and ambulatory to baseline. Patient verbalized understanding of the discharge instructions.  Patient belongings were taken by the patient.   

## 2022-09-22 NOTE — ED Provider Notes (Signed)
[facility] EMERGENCY DEPARTMENT Provider Note   CSN: [id number] Arrival date & time: 09/21/22  1311     History  Chief Complaint  Patient presents with   Fatigue    Thomas Holt is a 51 y.o. male.  HPI   Patient presents due to feeling fatigued.  The started yesterday, he tried to get a bed and felt very weak all over.  This resolved while in the waiting room.  He denies any chest pain, shortness of breath, nausea, vomiting, vision changes, headache, lateralized weakness or numbness.  There is take medicine for any comorbidities.  Works in [employer], also on classes right now.  Unsure if he has had any sick contacts.  Home Medications Prior to Admission medications   Medication Sig Start Date End Date Taking? Authorizing Provider  ibuprofen (ADVIL) 600 MG tablet Take 1 tablet (600 mg total) by mouth every 6 (six) hours as needed. Patient taking differently: Take 600 mg by mouth daily as needed for moderate pain. 06/15/22  Yes Scot Jun, FNP  ibuprofen (ADVIL) 800 MG tablet Take 1 tablet (800 mg total) by mouth every 8 (eight) hours as needed. Patient not taking: Reported on 09/22/2022 08/06/22   Erroll Luna, MD  methocarbamol (ROBAXIN-750) 750 MG tablet Take 1 tablet (750 mg total) by mouth every 8 (eight) hours as needed for muscle spasms. Patient not taking: Reported on 09/22/2022 08/06/22   Erroll Luna, MD  oxyCODONE (OXY IR/ROXICODONE) 5 MG immediate release tablet Take 1 tablet (5 mg total) by mouth every 6 (six) hours as needed for severe pain. Patient not taking: Reported on 09/22/2022 08/06/22   Erroll Luna, MD      Allergies    Patient has no known allergies.    Review of Systems   Review of Systems  Physical Exam Updated Vital Signs BP 96/64 (BP Location: Left Arm)   Pulse 70   Temp 97.7 F (36.5 C)   Resp 16   Ht 5\' 11"  (1.803 m)   Wt 83.5 kg   SpO2 98%   BMI 25.66 kg/m  Physical Exam Vitals and nursing note  reviewed. Exam conducted with a chaperone present.  Constitutional:      Appearance: Normal appearance.  HENT:     Head: Normocephalic and atraumatic.  Eyes:     General: No scleral icterus.       Right eye: No discharge.        Left eye: No discharge.     Extraocular Movements: Extraocular movements intact.     Pupils: Pupils are equal, round, and reactive to light.  Cardiovascular:     Rate and Rhythm: Normal rate and regular rhythm.     Pulses: Normal pulses.     Heart sounds: Normal heart sounds. No murmur heard.    No friction rub. No gallop.  Pulmonary:     Effort: Pulmonary effort is normal. No respiratory distress.     Breath sounds: Normal breath sounds.  Abdominal:     General: Abdomen is flat. Bowel sounds are normal. There is no distension.     Palpations: Abdomen is soft.     Tenderness: There is no abdominal tenderness.  Skin:    General: Skin is warm and dry.     Coloration: Skin is not jaundiced.  Neurological:     General: No focal deficit present.     Mental Status: He is alert. Mental status is at baseline.     Cranial Nerves: No  cranial nerve deficit.     Sensory: No sensory deficit.     Coordination: Coordination normal.     ED Results / Procedures / Treatments   Labs (all labs ordered are listed, but only abnormal results are displayed) Labs Reviewed  BASIC METABOLIC PANEL - Abnormal; Notable for the following components:      Result Value   Glucose, Bld 113 (*)    All other components within normal limits  CBC WITH DIFFERENTIAL/PLATELET  URINALYSIS, ROUTINE W REFLEX MICROSCOPIC    EKG None  Radiology No results found.  Procedures Procedures    Medications Ordered in ED Medications - No data to display  ED Course/ Medical Decision Making/ A&P                           Medical Decision Making  Patient presents due to fatigue.  Differential includes but not limited to radiation, AKI, sepsis, metabolic abnormality, infection,  On  exam patient meets no SIRS criteria.  He has no focal deficits neurologically.  Abdomen is soft nontender, regular rate and rhythm.  Patient is currently asymptomatic on my evaluation. -BP 96/64 (BP Location: Left Arm)   Pulse 70   Temp 97.7 F (36.5 C)   Resp 16   Ht 5\' 11"  (1.803 m)   Wt 83.5 kg   SpO2 98%   BMI 25.66 kg/m   I ordered, viewed and interpreted laboratory work-up.  No leukocytosis or anemia.  No gross electrolyte derangement or AKI.  UA is within normal limits.  I reevaluate the patient is resting comfortably and again symptom-free.  Unclear etiology for his spell of decreased energy.  Patient evaluation today I do not think any additional work-up is indicated.  Not consistent with rhabdo, no risk factors for it either.  No lateralized weakness numbness likely suggestive of a TIA or CVA.  Laboratory work-up also very reassuring.  Patient will follow-up with his PCP, work and school note provided.  Patient stable at this time for outpatient follow-up.  Return precaution discussed.        Final Clinical Impression(s) / ED Diagnoses Final diagnoses:  Weakness    Rx / DC Orders ED Discharge Orders     None         , Theron Arista 09/22/22 09/24/22    Tegeler, [id number], MD 09/22/22 1330

## 2022-10-16 ENCOUNTER — Encounter (HOSPITAL_COMMUNITY): Payer: Self-pay | Admitting: Oncology

## 2022-10-16 ENCOUNTER — Emergency Department (HOSPITAL_COMMUNITY)
Admission: EM | Admit: 2022-10-16 | Discharge: 2022-10-16 | Disposition: A | Discharge status: Home / Self Care | Payer: Commercial Managed Care - HMO | Attending: Emergency Medicine | Admitting: Emergency Medicine

## 2022-10-16 ENCOUNTER — Other Ambulatory Visit: Payer: Self-pay

## 2022-10-16 ENCOUNTER — Emergency Department (HOSPITAL_COMMUNITY): Payer: Commercial Managed Care - HMO

## 2022-10-16 DIAGNOSIS — N433 Hydrocele, unspecified: Secondary | ICD-10-CM | POA: Diagnosis not present

## 2022-10-16 DIAGNOSIS — R309 Painful micturition, unspecified: Secondary | ICD-10-CM | POA: Diagnosis not present

## 2022-10-16 DIAGNOSIS — R300 Dysuria: Secondary | ICD-10-CM | POA: Diagnosis present

## 2022-10-16 LAB — URINALYSIS, ROUTINE W REFLEX MICROSCOPIC
Bilirubin Urine: NEGATIVE
Glucose, UA: NEGATIVE mg/dL
Hgb urine dipstick: NEGATIVE
Ketones, ur: NEGATIVE mg/dL
Leukocytes,Ua: NEGATIVE
Nitrite: NEGATIVE
Protein, ur: 30 mg/dL — AB
Specific Gravity, Urine: 1.027 (ref 1.005–1.030)
pH: 6 (ref 5.0–8.0)

## 2022-10-16 LAB — CBC WITH DIFFERENTIAL/PLATELET
Abs Immature Granulocytes: 0.02 10*3/uL (ref 0.00–0.07)
Basophils Absolute: 0 10*3/uL (ref 0.0–0.1)
Basophils Relative: 0 %
Eosinophils Absolute: 0 10*3/uL (ref 0.0–0.5)
Eosinophils Relative: 0 %
HCT: 47.2 % (ref 39.0–52.0)
Hemoglobin: 15.3 g/dL (ref 13.0–17.0)
Immature Granulocytes: 0 %
Lymphocytes Relative: 23 %
Lymphs Abs: 2.2 10*3/uL (ref 0.7–4.0)
MCH: 30.3 pg (ref 26.0–34.0)
MCHC: 32.4 g/dL (ref 30.0–36.0)
MCV: 93.5 fL (ref 80.0–100.0)
Monocytes Absolute: 0.4 10*3/uL (ref 0.1–1.0)
Monocytes Relative: 4 %
Neutro Abs: 7 10*3/uL (ref 1.7–7.7)
Neutrophils Relative %: 73 %
Platelets: 187 10*3/uL (ref 150–400)
RBC: 5.05 MIL/uL (ref 4.22–5.81)
RDW: 11.9 % (ref 11.5–15.5)
WBC: 9.8 10*3/uL (ref 4.0–10.5)
nRBC: 0 % (ref 0.0–0.2)

## 2022-10-16 LAB — COMPREHENSIVE METABOLIC PANEL
ALT: 14 U/L (ref 0–44)
AST: 18 U/L (ref 15–41)
Albumin: 3.9 g/dL (ref 3.5–5.0)
Alkaline Phosphatase: 57 U/L (ref 38–126)
Anion gap: 6 (ref 5–15)
BUN: 14 mg/dL (ref 6–20)
CO2: 25 mmol/L (ref 22–32)
Calcium: 8.7 mg/dL — ABNORMAL LOW (ref 8.9–10.3)
Chloride: 108 mmol/L (ref 98–111)
Creatinine, Ser: 0.99 mg/dL (ref 0.61–1.24)
GFR, Estimated: >60 mL/min (ref >60)
Glucose, Bld: 99 mg/dL (ref 70–99)
Potassium: 3.8 mmol/L (ref 3.5–5.1)
Sodium: 139 mmol/L (ref 135–145)
Total Bilirubin: 1 mg/dL (ref 0.3–1.2)
Total Protein: 7.5 g/dL (ref 6.5–8.1)

## 2022-10-16 NOTE — ED Provider Notes (Addendum)
Care handoff from Mertha Baars, PA-C at shift change. Please see their note for further information.   Briefly: Patient presents today with complaints of dysuria. Endorses right sided pain with urination as well as left testicular pain x 2 days.   Plan: UA and scrotal US pending. If hematuria present, consider CT renal for kidney stone rule out. Likely to be discharged.    Labs resulted and show no leukocytosis or anemia. UA noninfectious.   Scrotal US reveals no acute findings, small bilateral hydroceles.   Upon further discussion with the patient, he states that his symptoms have only occurred a few times over the past 2 days. He denies any pain currently. Given this with unremarkable work-up and no abdominal symptoms and abdomen that is soft and nontender and no hematuria, no indication for additional imaging at this time. Discussed this with patient who is understanding and in agreement. Patient continues to deny any discharge, declines need for STD testing. He feels stable to be discharged. Recommended close pcp follow-up. Patient is understanding and amenable with plan, educated on red flag symptoms that would prompt immediate return. Patient discharged in stable condition.      Vear Clock 10/16/22 1903    Silva Bandy, PA-C 10/16/22 1904    Rondel Baton, MD 10/24/22 [phone]

## 2022-10-16 NOTE — Discharge Instructions (Addendum)
As we discussed, your workup in the ER today was reassuring for acute findings.  Laboratory evaluation and ultrasound imaging did not reveal any emergent concerns today.  I recommend that you follow-up with your primary care doctor at your earliest convenience for further evaluation and management of your symptoms.  Return if development of any new or worsening symptoms.

## 2022-10-16 NOTE — ED Provider Notes (Signed)
[facility]-EMERGENCY DEPT Provider Note   CSN: [id number] Arrival date & time: 10/16/22  1259     History  Chief Complaint  Patient presents with   Flank Pain    Thomas Holt is a 51 y.o. male. With past medical history of hyperglycemia who presents to the [facility] department with dysuria  States pain in right side when he urinates. States that his side only hurts when he is actively urinating and then abates. He has had intermittent radiation into his scrotum and states the left testicle hurts. He denies nausea, vomiting, diarrhea or fevers. Denies hematuria. Denies scrotal swelling or penile discharge.   HPI     Home Medications Prior to Admission medications   Medication Sig Start Date End Date Taking? Authorizing Provider  ibuprofen (ADVIL) 600 MG tablet Take 1 tablet (600 mg total) by mouth every 6 (six) hours as needed. Patient taking differently: Take 600 mg by mouth daily as needed for moderate pain. 06/15/22   Bing Neighbors, FNP  ibuprofen (ADVIL) 800 MG tablet Take 1 tablet (800 mg total) by mouth every 8 (eight) hours as needed. Patient not taking: Reported on 09/22/2022 08/06/22   Harriette Bouillon, MD  methocarbamol (ROBAXIN-750) 750 MG tablet Take 1 tablet (750 mg total) by mouth every 8 (eight) hours as needed for muscle spasms. Patient not taking: Reported on 09/22/2022 08/06/22   Harriette Bouillon, MD  oxyCODONE (OXY IR/ROXICODONE) 5 MG immediate release tablet Take 1 tablet (5 mg total) by mouth every 6 (six) hours as needed for severe pain. Patient not taking: Reported on 09/22/2022 08/06/22   Harriette Bouillon, MD      Allergies    Patient has no known allergies.    Review of Systems   Review of Systems  Constitutional:  Negative for fever.  Genitourinary:  Positive for dysuria and testicular pain. Negative for hematuria and scrotal swelling.  All other systems reviewed and are negative.   Physical Exam Updated Vital Signs BP  116/75   Pulse 60   Temp 97.9 F (36.6 C) (Oral)   Resp 19   Ht 5\' 11"  (1.803 m)   Wt 81.6 kg   SpO2 96%   BMI 25.10 kg/m  Physical Exam Vitals and nursing note reviewed. Exam conducted with a chaperone present.  Constitutional:      General: He is not in acute distress.    Appearance: Normal appearance. He is not ill-appearing or toxic-appearing.  HENT:     Head: Normocephalic and atraumatic.  Eyes:     General: No scleral icterus. Cardiovascular:     Pulses: Normal pulses.  Pulmonary:     Effort: Pulmonary effort is normal. No respiratory distress.  Abdominal:     General: Bowel sounds are normal. There is no distension.     Tenderness: There is no abdominal tenderness. There is no guarding.  Genitourinary:    Penis: Normal.      Testes:        Left: Tenderness present. Mass, swelling, testicular hydrocele or varicocele not present.     Epididymis:     Left: Not enlarged.  Skin:    General: Skin is warm and dry.     Capillary Refill: Capillary refill takes less than 2 seconds.     Findings: No rash.  Neurological:     General: No focal deficit present.     Mental Status: He is alert and oriented to person, place, and time.  Psychiatric:  Mood and Affect: Mood normal.        Behavior: Behavior normal.        Thought Content: Thought content normal.        Judgment: Judgment normal.     ED Results / Procedures / Treatments   Labs (all labs ordered are listed, but only abnormal results are displayed) Labs Reviewed  COMPREHENSIVE METABOLIC PANEL - Abnormal; Notable for the following components:      Result Value   Calcium 8.7 (*)    All other components within normal limits  CBC WITH DIFFERENTIAL/PLATELET  URINALYSIS, ROUTINE W REFLEX MICROSCOPIC    EKG None  Radiology No results found.  Procedures Procedures    Medications Ordered in ED Medications - No data to display  ED Course/ Medical Decision Making/ A&P                            Medical Decision Making Amount and/or Complexity of Data Reviewed Labs: ordered. Radiology: ordered.  Care of patient handed off to [employer], PA-C. Patient pending UA and scrotal ultrasound to rule out epididymitis or other pathology. Treatment based on UA and imaging. If he has microscopic hematuria, can consider CT renal stone study, however patient presentation is not quite consistent with this. Anticipate discharge home. Please see her note for completion of care.  Final Clinical Impression(s) / ED Diagnoses Final diagnoses:  None    Rx / DC Orders ED Discharge Orders     None         Cristopher Peru, PA-C 10/16/22 1511    Derwood Kaplan, MD 10/17/22 1534

## 2022-10-16 NOTE — ED Triage Notes (Signed)
Pt bib [facility] from home d/t right sided flank pain w/ urination. Pt also c/o pain to scrotum. Denies injury. States symptoms started one day ago.

## 2022-11-19 ENCOUNTER — Ambulatory Visit: Payer: 59 | Admitting: Critical Care Medicine

## 2022-11-20 ENCOUNTER — Ambulatory Visit (HOSPITAL_COMMUNITY)
Admission: EM | Admit: 2022-11-20 | Discharge: 2022-11-20 | Disposition: A | Discharge status: Home / Self Care | Payer: Commercial Managed Care - HMO | Attending: Emergency Medicine | Admitting: Emergency Medicine

## 2022-11-20 ENCOUNTER — Encounter (HOSPITAL_COMMUNITY): Payer: Self-pay | Admitting: Emergency Medicine

## 2022-11-20 DIAGNOSIS — Z1152 Encounter for screening for COVID-19: Secondary | ICD-10-CM | POA: Diagnosis present

## 2022-11-20 DIAGNOSIS — R208 Other disturbances of skin sensation: Secondary | ICD-10-CM | POA: Insufficient documentation

## 2022-11-20 NOTE — ED Provider Notes (Signed)
[facility]    CSN: [id number] Arrival date & time: 11/20/22  1845      History   Chief Complaint Chief Complaint  Patient presents with   Hot Flashes    HPI Thomas Holt is a 51 y.o. male. Patient presents c/o of feeling of warmness that occurred today while he was at work.  Patient reports that the symptoms have now resolved and he just wanted to come in to get checked out.  He reports that some of his coworkers have had common cold viruses and one of his colleagues has been out of work due to [place].  Patient states every now and then at night he will feel feelings of warmness that will resolve when turning the [facility] on.  He states that he may need to get a fan to use at night.  He denies any cold symptoms, he denies any known fever, cough, rhinorrhea, nausea, vomiting, diarrhea, chest pain, shortness of breath, or palpitations.  Patient denies taking any medications for symptoms.  HPI  Past Medical History:  Diagnosis Date   Hyperglycemia 03/11/2022   Pneumonia     Patient Active Problem List   Diagnosis Date Noted   Status post bilateral hernia repair 08/06/2022   Dental abscess 06/18/2022   Colon cancer screening 03/11/2022   BMI 26.0-26.9,adult 03/11/2022   Right inguinal hernia 03/11/2022    Past Surgical History:  Procedure Laterality Date   APPENDECTOMY     INGUINAL HERNIA REPAIR Bilateral 08/06/2022   Procedure: OPEN BILATERAL INGUINAL HERNIA REPAIR WITH MESH;  Surgeon: Harriette Bouillon, MD;  [facility];  Service: General;  Laterality: Bilateral;   INSERTION OF MESH Bilateral 08/06/2022   Procedure: INSERTION OF MESH;  Surgeon: Harriette Bouillon, MD;  [facility];  Service: General;  Laterality: Bilateral;   SKIN GRAFT Left    from Left leg grafted to Right foot   UMBILICAL HERNIA REPAIR N/A 08/06/2022   Procedure: OPEN UMBILICAL HERNIA REPAIR;  Surgeon: Harriette Bouillon, MD;  [facility];  Service: General;  Laterality: N/A;        Home Medications    Prior to Admission medications   Not on File    Family History Family History  Problem Relation Age of Onset   Cancer Mother    Heart disease Father     Social History Social History   Tobacco Use   Smoking status: Never   Smokeless tobacco: Never  Vaping Use   Vaping Use: Never used  Substance Use Topics   Alcohol use: No   Drug use: No     Allergies   Patient has no known allergies.   Review of Systems Review of Systems Per HPI   Physical Exam Triage Vital Signs ED Triage Vitals  Enc Vitals Group     BP 11/20/22 2023 123/73     Pulse Rate 11/20/22 2023 78     Resp 11/20/22 2023 15     Temp 11/20/22 2023 (!) 97.3 F (36.3 C)     Temp Source 11/20/22 2023 Oral     SpO2 11/20/22 2023 97 %     Weight --      Height --      Head Circumference --      Peak Flow --      Pain Score 11/20/22 2022 0     Pain Loc --      Pain Edu? --      Excl. in GC? --  No data found.  Updated Vital Signs BP 123/73 (BP Location: Right Arm)   Pulse 78   Temp (!) 97.3 F (36.3 C) (Oral)   Resp 15   SpO2 97%     Physical Exam Vitals and nursing note reviewed.  Constitutional:      Appearance: Normal appearance. He is not ill-appearing or diaphoretic.  Cardiovascular:     Rate and Rhythm: Normal rate and regular rhythm.     Heart sounds: Normal heart sounds, S1 normal and S2 normal.  Pulmonary:     Effort: Pulmonary effort is normal.     Breath sounds: Normal breath sounds and air entry. No decreased breath sounds, wheezing, rhonchi or rales.  Neurological:     Mental Status: He is alert.      UC Treatments / Results  Labs (all labs ordered are listed, but only abnormal results are displayed) Labs Reviewed  SARS CORONAVIRUS 2 (TAT 6-24 HRS)    EKG   Radiology No results found.  Procedures Procedures (including critical care time)  Medications Ordered in UC Medications - No data to display  Initial Impression /  Assessment and Plan / UC Course  I have reviewed the triage vital signs and the nursing notes.  Pertinent labs & imaging results that were available during my care of the patient were reviewed by me and considered in my medical decision making (see chart for details).     Patient was evaluated for feeling of warmness and COVID-19 testing.  Based on symptomology, low suspicion of viral illness.  COVID-19 testing is pending.  He was made aware that if any additional symptoms occur, he should present for follow-up.  Patient verbalized understanding of instructions.  Charting was provided using a a verbal dictation system, charting was proofread for errors, errors may occur which could change the meaning of the information charted.   Final Clinical Impressions(s) / UC Diagnoses   Final diagnoses:  Encounter for screening for COVID-19  Feeling of warmness     Discharge Instructions      We will call you if any of your test results are positive, you may view these test results on MyChart.  As discussed, it may be helpful for you to get a fan for at night to use at bedtime.  Please make sure to drink 8 cups of water daily.      ED Prescriptions   None    PDMP not reviewed this encounter.   Debby Freiberg, NP 11/21/22 [phone]

## 2022-11-20 NOTE — ED Triage Notes (Signed)
Pt reports was at work and "got hot". Wanted to check to make sue not sick. Denies any other symptoms.

## 2022-11-20 NOTE — Discharge Instructions (Addendum)
We will call you if any of your test results are positive, you may view these test results on MyChart.  As discussed, it may be helpful for you to get a fan for at night to use at bedtime.  Please make sure to drink 8 cups of water daily.

## 2022-11-21 LAB — SARS CORONAVIRUS 2 (TAT 6-24 HRS): SARS Coronavirus 2: NEGATIVE

## 2022-11-25 ENCOUNTER — Ambulatory Visit: Payer: Commercial Managed Care - HMO | Admitting: Critical Care Medicine

## 2022-11-25 NOTE — Progress Notes (Deleted)
New Patient Office Visit  Subjective    Patient ID: Thomas Holt, male    DOB: [birth year]  Age: 52 y.o. MRN: [id number]  CC:  No chief complaint on file.   HPI Thomas Holt presents to establish care 02/2022 This patient is a employee of [employer] he works as a Dealer.  In addition he also does some part-time work at [employer].  [facility] and had a series of labs drawn including renal panel liver panel blood counts urine study and PSA.  He will get these reports to me after he signs patient information release form.  They also suggest that he needs a colonoscopy.  We had [facility] the patient in the [place] [facility] as a screening study exam.  At that visit he had complaints of right inguinal swelling.  He had a right inguinal hernia and we have already made a referral for him to general surgery and they have already seen the patient.  He is trying to save money for the co-pay he will need to have a open inguinal hernia repair with mesh placement with Dr. Luisa Hart.  Another issue is that he had an episode of presyncope with dizziness and fatigue and went to the emergency room a week ago.  They found he had ketones in the urine volume depletion blood sugar of 230 they gave him fluids and sent him home.  He has a history of significant obesity in the past and that he weighed over 250 pounds and over 2 years lost a significant amount of weight.  He comes in today to 190 pounds BMI of 26-1/2.  He does state he has some polyuria and polyphagia.  He does not have polydipsia.  He states his dizziness has improved somewhat.  He has been trying to eat more healthy food options.  He said at 1 point he was just eating too many bananas.  The patient does not have any other complaints at this time.  His mother did have diabetes.  Patient did have an open emergent appendectomy when he was 52 years of age at [facility] as his  only surgical history Only other complaint he has is about a mild upper quadrant tenderness in the abdomen at times The patient does not drink and does not use any risky substances  7/27 Patient recently seen for left lower jaw pain found to have abscess molar with carious teeth.  He was prescribed amoxicillin and high-dose ibuprofen this was 2 days ago.  Patient is here for follow-up.  On arrival blood pressure 122/81.  He states his vision is okay he has some left ear pain but no headaches.  A1c on arrival 5.1.  He never picked up any glucose testing supplies.  He has been trying to eat a healthier diet. He is awaiting GI colonoscopy.  11/25/22  Dental abscess    Patient to complete course of therapy with amoxicillin  Continue ibuprofen high dose  Patient encouraged to make a dental appointment he was given a list of dental resources        Other   Colon cancer screening    Awaiting scheduling of colonoscopy      RESOLVED: Hyperglycemia    A1c now normal blood sugar normal he does not need to have glucose testing he will discontinue to follow a healthy diet      Other Visit Diagnoses     Screening  for diabetes mellitus (DM)    -  Primary   No outpatient encounter medications on file as of 11/25/2022.   No facility-administered encounter medications on file as of 11/25/2022.    Past Medical History:  Diagnosis Date   Hyperglycemia 03/11/2022   Pneumonia     Past Surgical History:  Procedure Laterality Date   APPENDECTOMY     INGUINAL HERNIA REPAIR Bilateral 08/06/2022   Procedure: OPEN BILATERAL INGUINAL HERNIA REPAIR WITH MESH;  Surgeon: Erroll Luna, MD;  Location: [place];  Service: General;  Laterality: Bilateral;   INSERTION OF MESH Bilateral 08/06/2022   Procedure: INSERTION OF MESH;  Surgeon: Erroll Luna, MD;  Location: [place];  Service: General;  Laterality: Bilateral;   SKIN GRAFT Left    from Left leg grafted to Right foot   UMBILICAL HERNIA REPAIR N/A  08/06/2022   Procedure: [place];  Surgeon: Erroll Luna, MD;  Location: [place];  Service: General;  Laterality: N/A;    Family History  Problem Relation Age of Onset   Cancer Mother    Heart disease Father     Social History   Socioeconomic History   Marital status: Divorced    Spouse name: Not on file   Number of children: 1   Years of education: Not on file   Highest education level: Not on file  Occupational History   Occupation: Chief of Staff    Comment: [employer]  Tobacco Use   Smoking status: Never   Smokeless tobacco: Never  Vaping Use   Vaping Use: Never used  Substance and Sexual Activity   Alcohol use: No   Drug use: No   Sexual activity: Not Currently    Birth control/protection: Condom  Other Topics Concern   Not on file  Social History Narrative   Not on file   Social Determinants of Health   Financial Resource Strain: Not on file  Food Insecurity: Not on file  Transportation Needs: Not on file  Physical Activity: Not on file  Stress: Not on file  Social Connections: Not on file  Intimate Partner Violence: Not on file    Review of Systems  Constitutional:  Negative for chills, diaphoresis, fever, malaise/fatigue and weight loss.  HENT:  Negative for congestion, hearing loss, nosebleeds, sore throat and tinnitus.        Dental pain  Eyes: Negative.  Negative for blurred vision, photophobia and redness.  Respiratory: Negative.  Negative for cough, hemoptysis, sputum production, shortness of breath, wheezing and stridor.   Cardiovascular: Negative.  Negative for chest pain, palpitations, orthopnea, claudication, leg swelling and PND.  Gastrointestinal:  Negative for abdominal pain, blood in stool, constipation, diarrhea, heartburn, melena, nausea and vomiting.       Polyphagia    Genitourinary:  Negative for dysuria, flank pain, frequency, hematuria and urgency.       Nocturia polyuria  Musculoskeletal:  Negative for  back pain, falls, joint pain, myalgias and neck pain.  Skin:  Negative for itching and rash.  Neurological:  Negative for dizziness, tingling, tremors, sensory change, speech change, focal weakness, seizures, loss of consciousness, weakness and headaches.  Endo/Heme/Allergies:  Negative for environmental allergies and polydipsia. Does not bruise/bleed easily.  Psychiatric/Behavioral:  Negative for depression, memory loss, substance abuse and suicidal ideas. The patient is not nervous/anxious and does not have insomnia.         Objective    There were no vitals taken for this visit.  Physical Exam Vitals reviewed.  Constitutional:      Appearance: Normal appearance. He is well-developed. He is not diaphoretic.  HENT:     Head: Normocephalic and atraumatic.     Right Ear: Tympanic membrane normal.     Left Ear: Tympanic membrane normal.     Nose: Congestion and rhinorrhea present. No nasal deformity, septal deviation or mucosal edema.     Right Sinus: No maxillary sinus tenderness or frontal sinus tenderness.     Left Sinus: No maxillary sinus tenderness or frontal sinus tenderness.     Mouth/Throat:     Mouth: Mucous membranes are moist.     Pharynx: Oropharynx is clear. No oropharyngeal exudate or posterior oropharyngeal erythema.     Comments: Erythema and swelling in the gum around the last molar left lower jaw with severe dental caries of that molar as well as other molars and left and right upper and lower jaws Eyes:     General: No scleral icterus.    Conjunctiva/sclera: Conjunctivae normal.     Pupils: Pupils are equal, round, and reactive to light.  Neck:     Thyroid: No thyromegaly.     Vascular: No carotid bruit or JVD.     Trachea: Trachea normal. No tracheal tenderness or tracheal deviation.  Cardiovascular:     Rate and Rhythm: Normal rate and regular rhythm.     Chest Wall: PMI is not displaced.     Pulses: Normal pulses. No decreased pulses.     Heart sounds:  Normal heart sounds, S1 normal and S2 normal. Heart sounds not distant. No murmur heard.    No systolic murmur is present.     No diastolic murmur is present.     No friction rub. No gallop. No S3 or S4 sounds.  Pulmonary:     Effort: No tachypnea, accessory muscle usage or respiratory distress.     Breath sounds: No stridor. No decreased breath sounds, wheezing, rhonchi or rales.  Chest:     Chest wall: No tenderness.  Abdominal:     General: Bowel sounds are normal. There is no distension.     Palpations: Abdomen is soft. Abdomen is not rigid.     Tenderness: There is no abdominal tenderness. There is no guarding or rebound.     Hernia: A hernia is present.  Musculoskeletal:        General: Normal range of motion.     Cervical back: Normal range of motion and neck supple. No edema, erythema or rigidity. No muscular tenderness. Normal range of motion.  Lymphadenopathy:     Head:     Right side of head: No submental or submandibular adenopathy.     Left side of head: No submental or submandibular adenopathy.     Cervical: No cervical adenopathy.  Skin:    General: Skin is warm and dry.     Coloration: Skin is not pale.     Findings: No rash.     Nails: There is no clubbing.  Neurological:     General: No focal deficit present.     Mental Status: He is alert and oriented to person, place, and time.     Sensory: No sensory deficit.  Psychiatric:        Mood and Affect: Mood normal.        Speech: Speech normal.        Behavior: Behavior normal.        Thought Content: Thought content normal.  Judgment: Judgment normal.         Assessment & Plan:   Problem List Items Addressed This Visit   None No follow-ups on file.   Shan Levans, MD

## 2023-02-03 ENCOUNTER — Telehealth: Payer: Self-pay | Admitting: Critical Care Medicine

## 2023-02-03 ENCOUNTER — Other Ambulatory Visit: Payer: Self-pay

## 2023-02-03 ENCOUNTER — Ambulatory Visit: Payer: BLUE CROSS/BLUE SHIELD | Attending: Critical Care Medicine | Admitting: Critical Care Medicine

## 2023-02-03 ENCOUNTER — Encounter: Payer: Self-pay | Admitting: Critical Care Medicine

## 2023-02-03 ENCOUNTER — Emergency Department (HOSPITAL_COMMUNITY)
Admission: EM | Admit: 2023-02-03 | Discharge: 2023-02-03 | Discharge status: Against Medical Advice | Payer: BLUE CROSS/BLUE SHIELD | Attending: Emergency Medicine | Admitting: Emergency Medicine

## 2023-02-03 ENCOUNTER — Ambulatory Visit: Payer: BLUE CROSS/BLUE SHIELD | Admitting: Critical Care Medicine

## 2023-02-03 DIAGNOSIS — Z8719 Personal history of other diseases of the digestive system: Secondary | ICD-10-CM | POA: Diagnosis not present

## 2023-02-03 DIAGNOSIS — Z5321 Procedure and treatment not carried out due to patient leaving prior to being seen by health care provider: Secondary | ICD-10-CM | POA: Insufficient documentation

## 2023-02-03 DIAGNOSIS — K047 Periapical abscess without sinus: Secondary | ICD-10-CM

## 2023-02-03 DIAGNOSIS — Z9889 Other specified postprocedural states: Secondary | ICD-10-CM | POA: Diagnosis not present

## 2023-02-03 DIAGNOSIS — R600 Localized edema: Secondary | ICD-10-CM | POA: Diagnosis not present

## 2023-02-03 MED ORDER — AMOXICILLIN-POT CLAVULANATE 875-125 MG PO TABS: 1.0000 | ORAL_TABLET | Freq: Two times a day (BID) | ORAL | 0 refills | Status: DC

## 2023-02-03 MED ORDER — IBUPROFEN 600 MG PO TABS: 600.0000 mg | ORAL_TABLET | Freq: Three times a day (TID) | ORAL | 0 refills | Status: DC | PRN

## 2023-02-03 NOTE — Telephone Encounter (Signed)
Called patient and he is aware. 

## 2023-02-03 NOTE — ED Notes (Signed)
Pt stated that he was leaving and going to the [place].

## 2023-02-03 NOTE — Progress Notes (Unsigned)
Established Patient Office Visit  Subjective   Patient ID: Thomas Holt, male    DOB: Jan 31, 1971  Age: 52 y.o. MRN: [id number] Virtual Visit via Telephone Note  I connected with Thomas Holt on 02/03/23 at  2:30 PM EDT by telephone and verified that I am speaking with the correct person using two identifiers.   Consent:  I discussed the limitations, risks, security and privacy concerns of performing an evaluation and management service by telephone and the availability of in person appointments. I also discussed with the patient that there may be a patient responsible charge related to this service. The patient expressed understanding and agreed to proceed.  Location of patient: Patient's at home  Location of provider: I am in my office at home  Persons participating in the televisit with the patient.   No one else on the call    History of Present Illness    CC: Dental pain  52 y.o. male pcp pt of Dr. 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  He is yet to achieve dental appointment.  He now has [employer] and did buy a Surveyor, minerals.  The patient is willing to see a dentist referral will need to be made.  He was in the ER this morning when I called to let him know his appointment is to be changes virtual.  He agreed to come to this appointment and left the emergency room without being seen  Note patient does have improvement after right inguinal hernia has been repaired.          Review of Systems  Constitutional:  Negative for chills, diaphoresis, fever, malaise/fatigue and weight loss.  HENT:  Negative for congestion, hearing loss, nosebleeds, sore throat and tinnitus.        Dental pain  Eyes:  Negative for blurred  vision, photophobia and redness.  Respiratory:  Negative for cough, hemoptysis, sputum production, shortness of breath, wheezing and stridor.   Cardiovascular:  Negative for chest pain, palpitations, orthopnea, claudication, leg swelling and PND.  Gastrointestinal:  Negative for abdominal pain, blood in stool, constipation, diarrhea, heartburn, nausea and vomiting.  Genitourinary:  Negative for dysuria, flank pain, frequency, hematuria and urgency.  Musculoskeletal:  Negative for back pain, falls, joint pain, myalgias and neck pain.  Skin:  Negative for itching and rash.  Neurological:  Negative for dizziness, tingling, tremors, sensory change, speech change, focal weakness, seizures, loss of consciousness, weakness and headaches.  Endo/Heme/Allergies:  Negative for environmental allergies and polydipsia. Does not bruise/bleed easily.  Psychiatric/Behavioral:  Negative for depression, memory loss, substance abuse and suicidal ideas. The patient is not nervous/anxious and does not have insomnia.       Objective:     There were no vitals taken for this visit.  No exam this is a telephone visit Physical Exam   No results found for any visits on 02/03/23.    The ASCVD Risk score (Arnett DK, et al., 2019) failed to calculate for the following reasons:   Cannot find a previous HDL lab   Cannot find a previous total cholesterol lab    Assessment & Plan:   Problem List Items Addressed This Visit       Digestive   Dental  abscess - Primary    Recurrent dental abscess patient be given Augmentin for 7 days and ibuprofen 600 mg every 8 to 6 hours as needed for pain  Referral to dentistry was made as an urgent basis      Relevant Orders   Ambulatory referral to Dentistry     Other   Status post bilateral hernia repair    Resolved hernia after hernia repair      Follow Up Instructions:  Patient knows to keep follow-up appointment and knows antibiotics were sent to pharmacy  along with high-dose ibuprofen referral to dental clinic will be made I discussed the assessment and treatment plan with the patient. The patient was provided an opportunity to ask questions and all were answered. The patient agreed with the plan and demonstrated an understanding of the instructions.   The patient was advised to call back or seek an in-person evaluation if the symptoms worsen or if the condition fails to improve as anticipated.  I provided 30 minutes of non-face-to-face time during this encounter  including  median intraservice time , review of notes, labs, imaging, medications  and explaining diagnosis and management to the patient .    Thomas Noble, MD

## 2023-02-03 NOTE — Telephone Encounter (Signed)
Copied from [place] [id number]. Topic: General - Other >> Feb 03, 2023  8:41 AM Chapman Fitch wrote: Reason for CRM: Pt stated he received a call this morning from Dr. Joya Gaskins that his appt today would be cancelled and a medication would be sent to the pharmacy for him / please advise pt when RX is sent

## 2023-02-03 NOTE — Telephone Encounter (Signed)
Let pt know:   Pt with dental abscess will order motrin and augmentin and get pt in dental care

## 2023-02-03 NOTE — ED Triage Notes (Signed)
Pt bib PTAR from home with complaints of left cheek swelling since waking up this am. Denies pain now but says he had pain yesterday.

## 2023-02-03 NOTE — Progress Notes (Deleted)
New Patient Office Visit  Subjective    Patient ID: [facility], male    DOB: 08/16/1971  Age: 52 y.o. MRN: [id number]  CC:  No chief complaint on file.   HPI [facility] presents to establish care 02/2022 This patient is a employee of [employer] [facility] he works as a Chief of Staff.  In addition he also does some part-time work at [employer].  [facility] and had a series of labs drawn including renal panel liver panel blood counts urine study and PSA.  He will get these reports to me after he signs patient information release form.  They also suggest that he needs a colonoscopy.  We had seen the patient in the [facility] as a screening study exam.  At that visit he had complaints of right inguinal swelling.  He had a right inguinal hernia and we have already made a referral for him to general surgery and they have already seen the patient.  He is trying to save money for the co-pay he will need to have a open inguinal hernia repair with mesh placement with Dr. Brantley Stage.  Another issue is that he had an episode of presyncope with dizziness and fatigue and went to the emergency room a week ago.  They found he had ketones in the urine volume depletion blood sugar of 230 they gave him fluids and sent him home.  He has a history of significant obesity in the past and that he weighed over 250 pounds and over 2 years lost a significant amount of weight.  He comes in today to 190 pounds BMI of 26-1/2.  He does state he has some polyuria and polyphagia.  He does not have polydipsia.  He states his dizziness has improved somewhat.  He has been trying to eat more healthy food options.  He said at 1 point he was just eating too many bananas.  The patient does not have any other complaints at this time.  His mother did have diabetes.  Patient did have an open emergent appendectomy when he was 52 years of age at [facility] as his  only surgical history Only other complaint he has is about a mild upper quadrant tenderness in the abdomen at times The patient does not drink and does not use any risky substances  7/27 Patient recently seen for left lower jaw pain found to have abscess molar with carious teeth.  He was prescribed amoxicillin and high-dose ibuprofen this was 2 days ago.  Patient is here for follow-up.  On arrival blood pressure 122/81.  He states his vision is okay he has some left ear pain but no headaches.  A1c on arrival 5.1.  He never picked up any glucose testing supplies.  He has been trying to eat a healthier diet. He is awaiting GI colonoscopy.  02/03/23  No outpatient encounter medications on file as of 02/03/2023.   No facility-administered encounter medications on file as of 02/03/2023.    Past Medical History:  Diagnosis Date   Hyperglycemia 03/11/2022   Pneumonia     Past Surgical History:  Procedure Laterality Date   APPENDECTOMY     INGUINAL HERNIA REPAIR Bilateral 08/06/2022   Procedure: OPEN BILATERAL INGUINAL HERNIA REPAIR WITH MESH;  Surgeon: Erroll Luna, MD;  Location: [facility];  Service: General;  Laterality: Bilateral;   INSERTION OF MESH Bilateral 08/06/2022   Procedure: INSERTION OF MESH;  Surgeon:  Erroll Luna, MD;  Location: [facility];  Service: General;  Laterality: Bilateral;   SKIN GRAFT Left    from Left leg grafted to Right foot   UMBILICAL HERNIA REPAIR N/A 08/06/2022   Procedure: OPEN UMBILICAL HERNIA REPAIR;  Surgeon: Erroll Luna, MD;  Location: [facility];  Service: General;  Laterality: N/A;    Family History  Problem Relation Age of Onset   Cancer Mother    Heart disease Father     Social History   Socioeconomic History   Marital status: Divorced    Spouse name: Not on file   Number of children: 1   Years of education: Not on file   Highest education level: Not on file  Occupational History   Occupation: Chief of Staff    Comment: [employer]   Tobacco Use   Smoking status: Never   Smokeless tobacco: Never  Vaping Use   Vaping Use: Never used  Substance and Sexual Activity   Alcohol use: No   Drug use: No   Sexual activity: Not Currently    Birth control/protection: Condom  Other Topics Concern   Not on file  Social History Narrative   Not on file   Social Determinants of Health   Financial Resource Strain: Not on file  Food Insecurity: Not on file  Transportation Needs: Not on file  Physical Activity: Not on file  Stress: Not on file  Social Connections: Not on file  Intimate Partner Violence: Not on file    Review of Systems  Constitutional:  Negative for chills, diaphoresis, fever, malaise/fatigue and weight loss.  HENT:  Negative for congestion, hearing loss, nosebleeds, sore throat and tinnitus.        Dental pain  Eyes: Negative.  Negative for blurred vision, photophobia and redness.  Respiratory: Negative.  Negative for cough, hemoptysis, sputum production, shortness of breath, wheezing and stridor.   Cardiovascular: Negative.  Negative for chest pain, palpitations, orthopnea, claudication, leg swelling and PND.  Gastrointestinal:  Negative for abdominal pain, blood in stool, constipation, diarrhea, heartburn, melena, nausea and vomiting.       Polyphagia    Genitourinary:  Negative for dysuria, flank pain, frequency, hematuria and urgency.       Nocturia polyuria  Musculoskeletal:  Negative for back pain, falls, joint pain, myalgias and neck pain.  Skin:  Negative for itching and rash.  Neurological:  Negative for dizziness, tingling, tremors, sensory change, speech change, focal weakness, seizures, loss of consciousness, weakness and headaches.  Endo/Heme/Allergies:  Negative for environmental allergies and polydipsia. Does not bruise/bleed easily.  Psychiatric/Behavioral:  Negative for depression, memory loss, substance abuse and suicidal ideas. The patient is not nervous/anxious and does not have  insomnia.         Objective    There were no vitals taken for this visit.  Physical Exam Vitals reviewed.  Constitutional:      Appearance: Normal appearance. He is well-developed. He is not diaphoretic.  HENT:     Head: Normocephalic and atraumatic.     Right Ear: Tympanic membrane normal.     Left Ear: Tympanic membrane normal.     Nose: Congestion and rhinorrhea present. No nasal deformity, septal deviation or mucosal edema.     Right Sinus: No maxillary sinus tenderness or frontal sinus tenderness.     Left Sinus: No maxillary sinus tenderness or frontal sinus tenderness.     Mouth/Throat:     Mouth: Mucous membranes are moist.     Pharynx: Oropharynx is  clear. No oropharyngeal exudate or posterior oropharyngeal erythema.     Comments: Erythema and swelling in the gum around the last molar left lower jaw with severe dental caries of that molar as well as other molars and left and right upper and lower jaws Eyes:     General: No scleral icterus.    Conjunctiva/sclera: Conjunctivae normal.     Pupils: Pupils are equal, round, and reactive to light.  Neck:     Thyroid: No thyromegaly.     Vascular: No carotid bruit or JVD.     Trachea: Trachea normal. No tracheal tenderness or tracheal deviation.  Cardiovascular:     Rate and Rhythm: Normal rate and regular rhythm.     Chest Wall: PMI is not displaced.     Pulses: Normal pulses. No decreased pulses.     Heart sounds: Normal heart sounds, S1 normal and S2 normal. Heart sounds not distant. No murmur heard.    No systolic murmur is present.     No diastolic murmur is present.     No friction rub. No gallop. No S3 or S4 sounds.  Pulmonary:     Effort: No tachypnea, accessory muscle usage or respiratory distress.     Breath sounds: No stridor. No decreased breath sounds, wheezing, rhonchi or rales.  Chest:     Chest wall: No tenderness.  Abdominal:     General: Bowel sounds are normal. There is no distension.      Palpations: Abdomen is soft. Abdomen is not rigid.     Tenderness: There is no abdominal tenderness. There is no guarding or rebound.     Hernia: A hernia is present.  Musculoskeletal:        General: Normal range of motion.     Cervical back: Normal range of motion and neck supple. No edema, erythema or rigidity. No muscular tenderness. Normal range of motion.  Lymphadenopathy:     Head:     Right side of head: No submental or submandibular adenopathy.     Left side of head: No submental or submandibular adenopathy.     Cervical: No cervical adenopathy.  Skin:    General: Skin is warm and dry.     Coloration: Skin is not pale.     Findings: No rash.     Nails: There is no clubbing.  Neurological:     General: No focal deficit present.     Mental Status: He is alert and oriented to person, place, and time.     Sensory: No sensory deficit.  Psychiatric:        Mood and Affect: Mood normal.        Speech: Speech normal.        Behavior: Behavior normal.        Thought Content: Thought content normal.        Judgment: Judgment normal.         Assessment & Plan:   Problem List Items Addressed This Visit   None No follow-ups on file.   Asencion Noble, MD

## 2023-02-04 NOTE — Assessment & Plan Note (Signed)
Recurrent dental abscess patient be given Augmentin for 7 days and ibuprofen 600 mg every 8 to 6 hours as needed for pain  Referral to dentistry was made as an urgent basis

## 2023-02-04 NOTE — Assessment & Plan Note (Signed)
Resolved hernia after hernia repair

## 2023-02-05 NOTE — Telephone Encounter (Signed)
Called patient and it went straight to voicemail. Unable to leave voicemail due to mailbox being full

## 2023-02-05 NOTE — Telephone Encounter (Signed)
Pt is calling in returning a call. Pt says he picked up the medication that was prescribed.

## 2023-02-25 ENCOUNTER — Ambulatory Visit: Payer: BLUE CROSS/BLUE SHIELD | Attending: Critical Care Medicine | Admitting: Critical Care Medicine

## 2023-02-25 ENCOUNTER — Encounter: Payer: Self-pay | Admitting: Critical Care Medicine

## 2023-02-25 VITALS — BP 114/74 | HR 64 | Wt 199.2 lb

## 2023-02-25 DIAGNOSIS — K047 Periapical abscess without sinus: Secondary | ICD-10-CM | POA: Diagnosis not present

## 2023-02-25 DIAGNOSIS — H6122 Impacted cerumen, left ear: Secondary | ICD-10-CM

## 2023-02-25 MED ORDER — AMOXICILLIN-POT CLAVULANATE 875-125 MG PO TABS: 1.0000 | ORAL_TABLET | Freq: Two times a day (BID) | ORAL | 0 refills | Status: DC

## 2023-02-25 MED ORDER — DEBROX 6.5 % OT SOLN: 5.0000 [drp] | Freq: Two times a day (BID) | OTIC | 0 refills | Status: DC

## 2023-02-25 NOTE — Assessment & Plan Note (Signed)
Significant cerumen impaction removed with ear lavage will follow-up with [facility]

## 2023-02-25 NOTE — Progress Notes (Signed)
Established Patient Office Visit  Subjective   Patient ID: [facility], male    DOB: August 28, 1971  Age: 52 y.o. MRN: [id number]  CC: Dental pain  02/03/23 52 y.o. male pcp pt of Dr. 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  He is yet to achieve dental appointment.  He now has [employer] and did buy a [employer].  The patient is willing to see a dentist referral will need to be made.  He was in the ER this morning when I called to let him know his appointment is to be changes virtual.  He agreed to come to this appointment and left the emergency room without being seen  Note patient does have improvement after right inguinal hernia has been repaired.  02/25/23 Patient seen in return follow-up he complains of decreased hearing in the left ear and continued pain in the left lower jaw he has had a little dental abscess left lower jaw in the past just finished a course of antibiotics.  He needs to see an oral surgeon as he has multiple dental caries and teeth need to be pulled there are no other complaints.  On arrival blood pressure 114/74.          Review of Systems  Constitutional:  Negative for chills, diaphoresis, fever, malaise/fatigue and weight loss.  HENT:  Negative for congestion, hearing loss, nosebleeds, sore throat and tinnitus.        Dental pain  Eyes:  Negative for blurred vision, photophobia and redness.  Respiratory:  Negative for cough, hemoptysis, sputum production, shortness of breath, wheezing and stridor.   Cardiovascular:  Negative for chest pain, palpitations, orthopnea, claudication, leg swelling and PND.  Gastrointestinal:  Negative for abdominal pain, blood in stool, constipation, diarrhea, heartburn, nausea and  vomiting.  Genitourinary:  Negative for dysuria, flank pain, frequency, hematuria and urgency.  Musculoskeletal:  Negative for back pain, falls, joint pain, myalgias and neck pain.  Skin:  Negative for itching and rash.  Neurological:  Negative for dizziness, tingling, tremors, sensory change, speech change, focal weakness, seizures, loss of consciousness, weakness and headaches.  Endo/Heme/Allergies:  Negative for environmental allergies and polydipsia. Does not bruise/bleed easily.  Psychiatric/Behavioral:  Negative for depression, memory loss, substance abuse and suicidal ideas. The patient is not nervous/anxious and does not have insomnia.       Objective:     BP 114/74 (BP Location: Right Arm, Patient Position: Sitting, Cuff Size: Normal)   Pulse 64   Wt 199 lb 3.2 oz (90.4 kg)   SpO2 96%   BMI 27.78 kg/m   No exam this is a telephone visit Physical Exam Vitals reviewed.  Constitutional:      Appearance: Normal appearance. He is well-developed. He is not diaphoretic.  HENT:     Head: Normocephalic and atraumatic.     Right Ear: Tympanic membrane, ear canal and external ear normal. There is no impacted cerumen.     Left Ear: External ear normal. There is impacted cerumen.     Nose: No nasal deformity, septal deviation, mucosal edema or rhinorrhea.     Right Sinus: No maxillary sinus tenderness or frontal sinus tenderness.     Left Sinus: No maxillary sinus tenderness or  frontal sinus tenderness.     Mouth/Throat:     Mouth: Mucous membranes are moist.     Pharynx: Oropharynx is clear. No oropharyngeal exudate.     Comments: Multiple dental caries disarray in the left anterior and lower jaw previous abscess seen still erythema around the posterior molar left lower jaw Eyes:     General: No scleral icterus.    Conjunctiva/sclera: Conjunctivae normal.     Pupils: Pupils are equal, round, and reactive to light.  Neck:     Thyroid: No thyromegaly.     Vascular: No carotid  bruit or JVD.     Trachea: Trachea normal. No tracheal tenderness or tracheal deviation.  Cardiovascular:     Rate and Rhythm: Normal rate and regular rhythm.     Chest Wall: PMI is not displaced.     Pulses: Normal pulses. No decreased pulses.     Heart sounds: Normal heart sounds, S1 normal and S2 normal. Heart sounds not distant. No murmur heard.    No systolic murmur is present.     No diastolic murmur is present.     No friction rub. No gallop. No S3 or S4 sounds.  Pulmonary:     Effort: No tachypnea, accessory muscle usage or respiratory distress.     Breath sounds: No stridor. No decreased breath sounds, wheezing, rhonchi or rales.  Chest:     Chest wall: No tenderness.  Abdominal:     General: Bowel sounds are normal. There is no distension.     Palpations: Abdomen is soft. Abdomen is not rigid.     Tenderness: There is no abdominal tenderness. There is no guarding or rebound.  Musculoskeletal:        General: Normal range of motion.     Cervical back: Normal range of motion and neck supple. No edema, erythema or rigidity. No muscular tenderness. Normal range of motion.  Lymphadenopathy:     Head:     Right side of head: No submental or submandibular adenopathy.     Left side of head: No submental or submandibular adenopathy.     Cervical: No cervical adenopathy.  Skin:    General: Skin is warm and dry.     Coloration: Skin is not pale.     Findings: No rash.     Nails: There is no clubbing.  Neurological:     Mental Status: He is alert and oriented to person, place, and time.     Sensory: No sensory deficit.  Psychiatric:        Speech: Speech normal.        Behavior: Behavior normal.      No results found for any visits on 02/25/23.    The ASCVD Risk score (Arnett DK, et al., 2019) failed to calculate for the following reasons:   Cannot find a previous HDL lab   Cannot find a previous total cholesterol lab    Assessment & Plan:   Problem List Items  Addressed This Visit       Digestive   Dental abscess    Improved but not resolved patient needs to have oral surgery referral was made dental works will see the patient        Nervous and Auditory   Left ear impacted cerumen - Primary    Significant cerumen impaction removed with ear lavage will follow-up with [facility]      Relevant Orders   Ear Lavage    Asencion Noble, MD

## 2023-02-25 NOTE — Patient Instructions (Signed)
Will referral to oral surgery take your insurance our referral coordinator is working on this  Take Augmentin twice daily for another 10 days for your tooth infection  Your left ear was cleaned you have Glaxo Eardrum and minimal pick up today [facility] she is follow the directions for your lab here  Pick up fecal occult   Return Dr Joya Gaskins 4 months

## 2023-02-25 NOTE — Assessment & Plan Note (Signed)
Improved but not resolved patient needs to have oral surgery referral was made dental works will see the patient

## 2023-04-14 IMAGING — CT CT ABD-PELV W/ CM
2 of 5 series · 16 of 46 positions shown, 18 images · IV contrast (Omni 300)
Comparison: None.

CLINICAL DATA: Right lower quadrant pain

EXAM:
CT ABDOMEN AND PELVIS WITH CONTRAST
TECHNIQUE: Multidetector CT imaging of the abdomen and pelvis was performed
using the standard protocol following bolus administration of
intravenous contrast.

[Series 3: a/p w/ 5mm · axial · 0.88mm/px · z∈[+784,+1239]mm · 13 of 103 slices shown, 15 images]
[im 6/103  soft-tissue]
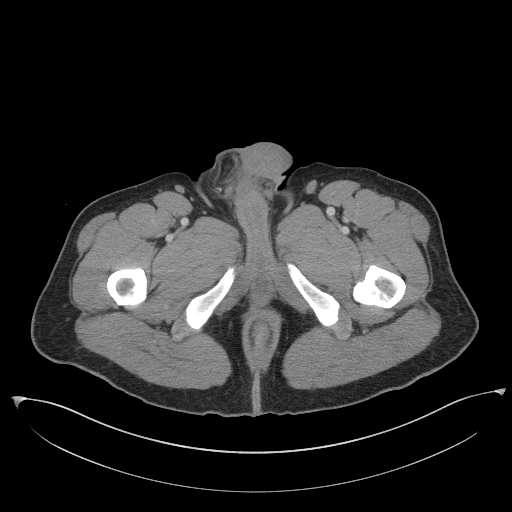
[im 6/103  bone]
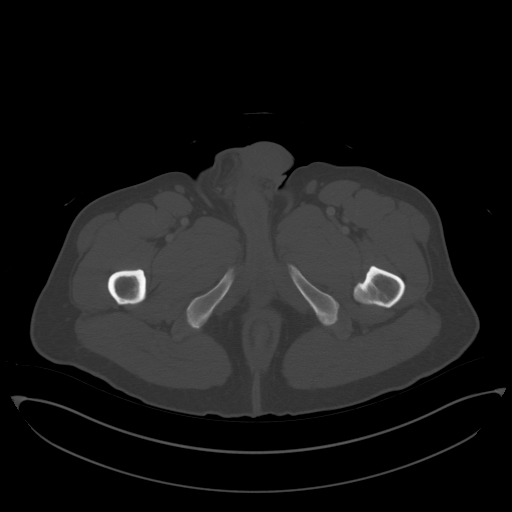
[im 16/103  soft-tissue]
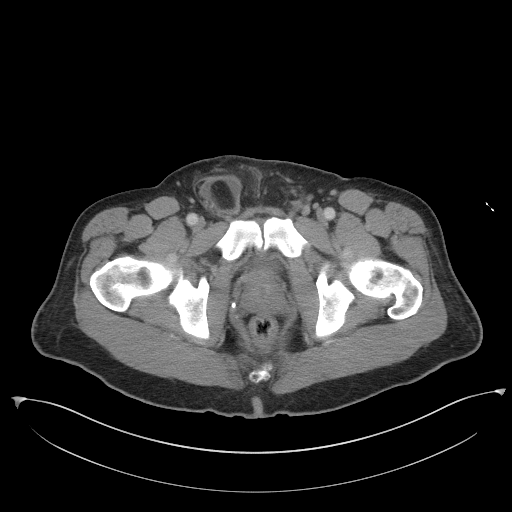
[im 21/103  soft-tissue]
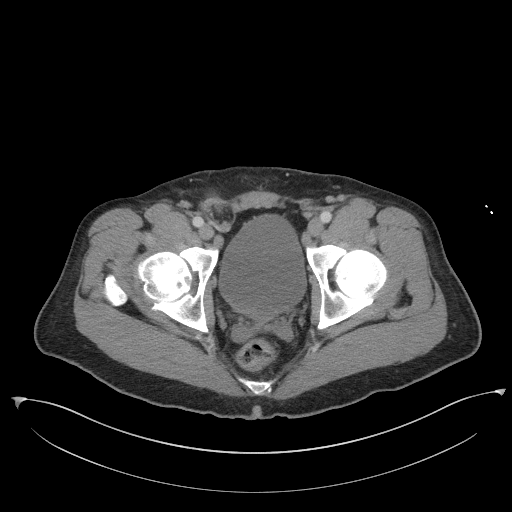
[im 31/103  soft-tissue]
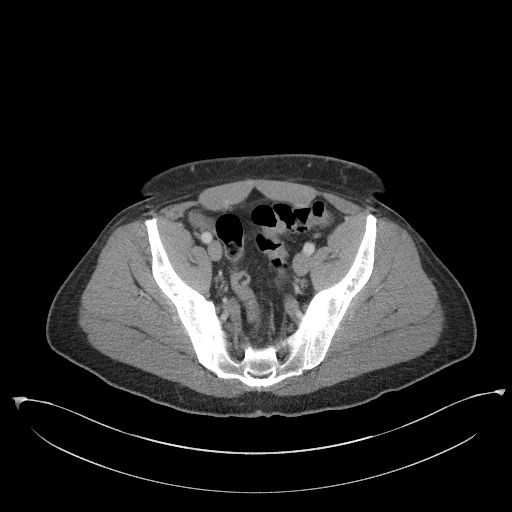
[im 36/103  soft-tissue]
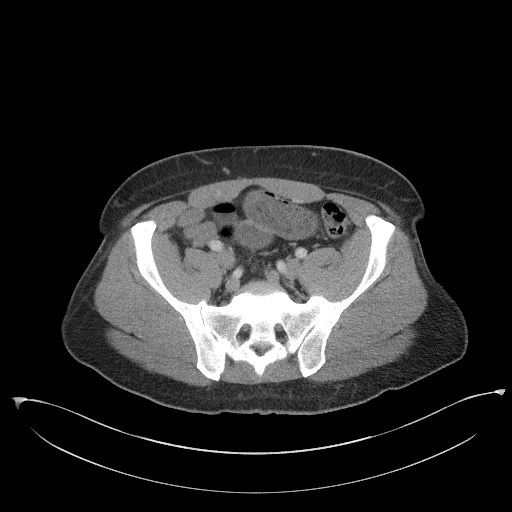
[im 46/103  soft-tissue]
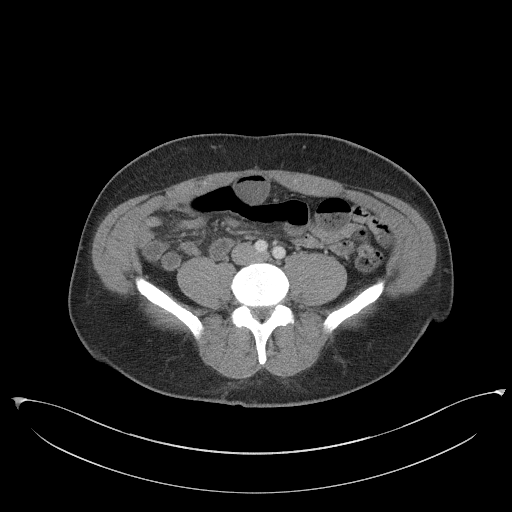
[im 52/103  soft-tissue]
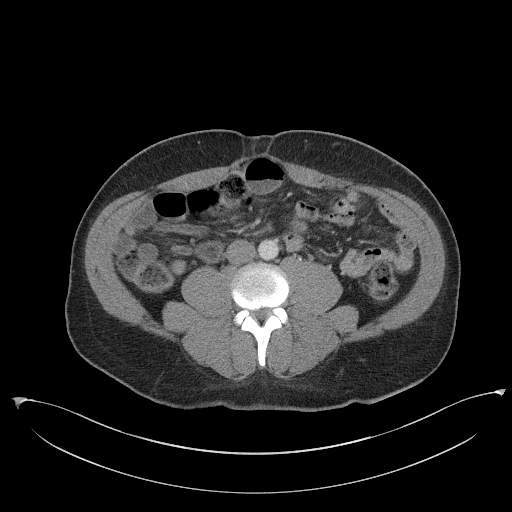
[im 57/103  soft-tissue]
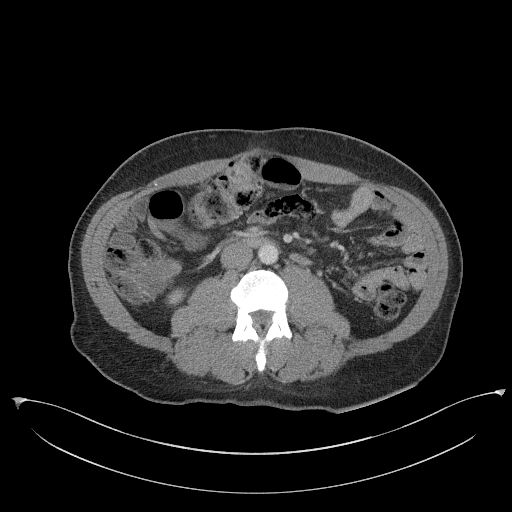
[im 67/103  soft-tissue]
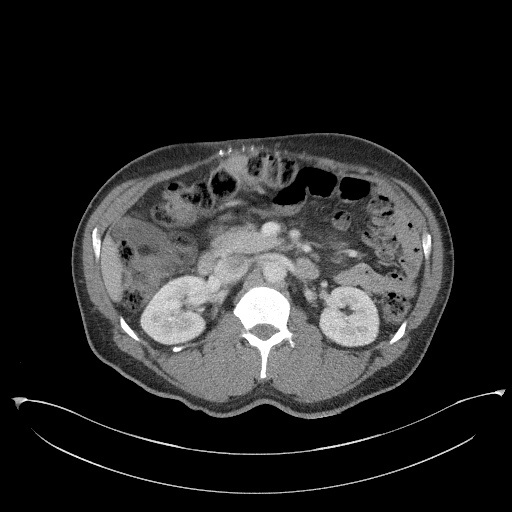
[im 67/103  bone]
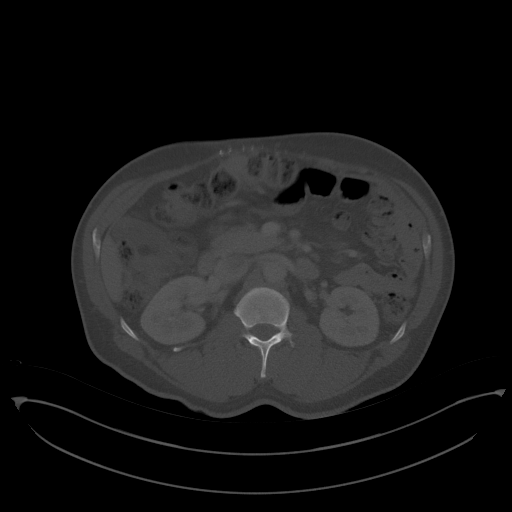
[im 72/103  soft-tissue]
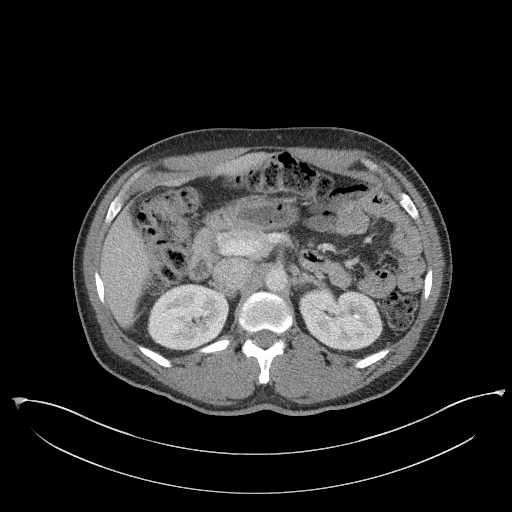
[im 82/103  soft-tissue]
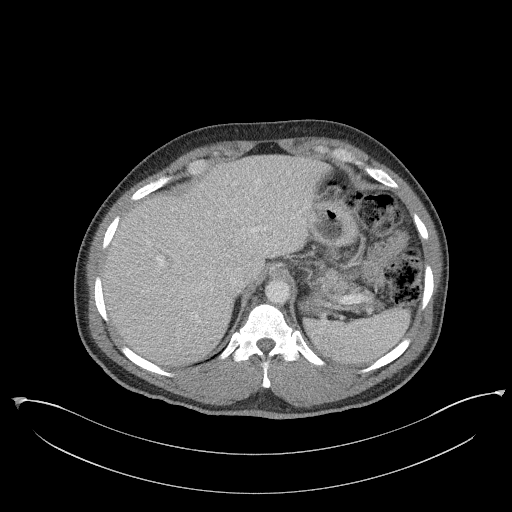
[im 87/103  soft-tissue]
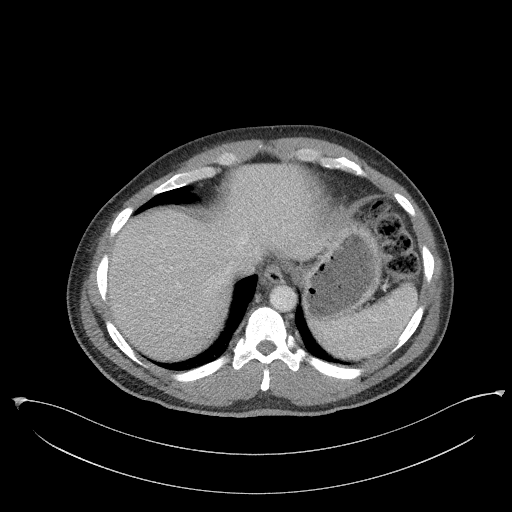
[im 97/103  soft-tissue]
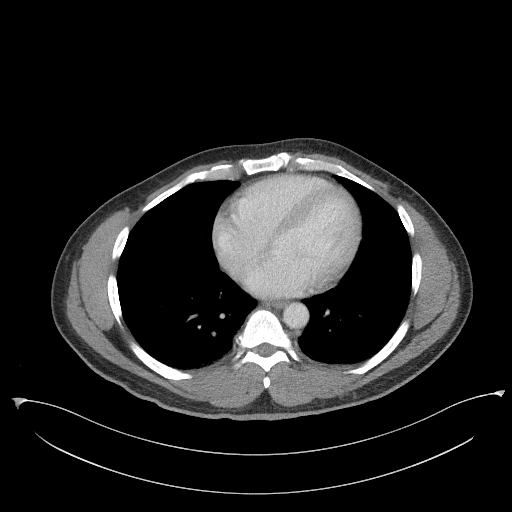

[Series 6: a/p w/ cor · coronal · 0.86mm/px · 3 of 148 slices shown]
[im 50/148  soft-tissue]
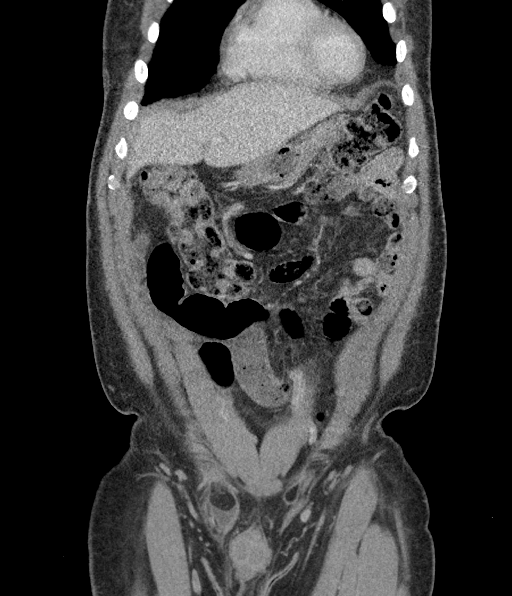
[im 66/148  soft-tissue]
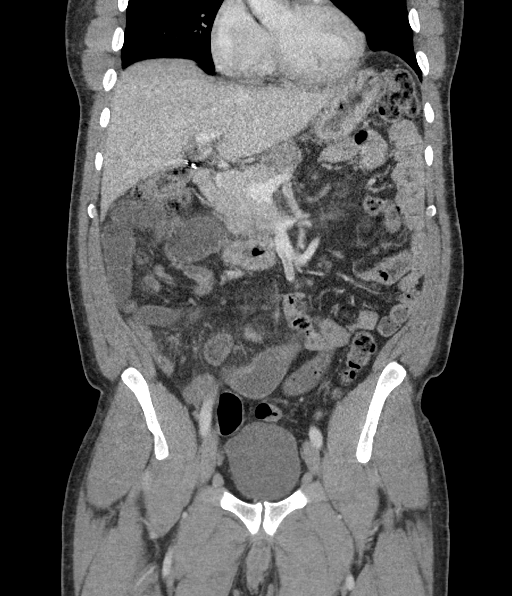
[im 82/148  soft-tissue]
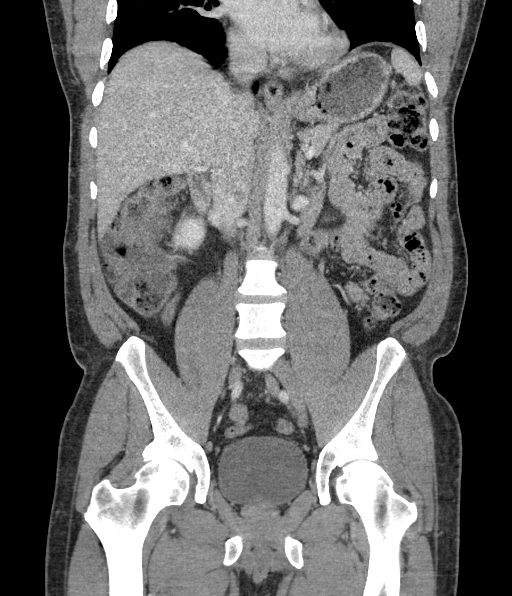

[16 of 46 positions shown; findings below may reference images not displayed]

RADIATION DOSE REDUCTION: This exam was performed according to the
departmental dose-optimization program which includes automated
exposure control, adjustment of the mA and/or kV according to
patient size and/or use of iterative reconstruction technique.

CONTRAST:  100mL OMNIPAQUE IOHEXOL 300 MG/ML  SOLN
FINDINGS: Lower chest: No acute abnormality

Hepatobiliary: No focal liver abnormality is seen. Status post
cholecystectomy. No biliary dilatation.

Pancreas: No focal abnormality or ductal dilatation.

Spleen: No focal abnormality.  Normal size.

Adrenals/Urinary Tract: No adrenal abnormality. No focal renal
abnormality. No stones or hydronephrosis. Urinary bladder is
unremarkable.

Stomach/Bowel: Prominent small bowel loops are noted centrally
containing fluid and some feculent material material. This is
nonspecific and could reflect focal ileus four partial small bowel
obstruction. Distal small bowel decompressed. Moderate stool
throughout the colon. Appendix not visualized.

Vascular/Lymphatic: No evidence of aneurysm or adenopathy.

Reproductive: No visible focal abnormality.

Other: No free fluid or free air. Moderate-sized right inguinal
hernia containing fat and fluid. Small umbilical hernia containing
fat.

Musculoskeletal: No acute bony abnormality.
IMPRESSION: Prominent central small bowel loops containing fluid and feculent
material. This could reflect focal ileus or partial small bowel
obstruction.

Moderate sized right inguinal hernia containing fat and fluid. Small
umbilical hernia containing fat.

## 2023-06-30 ENCOUNTER — Ambulatory Visit: Payer: BLUE CROSS/BLUE SHIELD | Admitting: Critical Care Medicine

## 2023-06-30 NOTE — Progress Notes (Deleted)
Established Patient Office Visit  Subjective   Patient ID: Thomas Holt, male    DOB: 1971-07-03  Age: 52 y.o. MRN: [id number]  CC: Dental pain  02/03/23 52 y.o. male pcp pt of Dr. 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  He is yet to achieve dental appointment.  He now has [employer] and did buy a Glass blower/designer.  The patient is willing to see a dentist referral will need to be made.  He was in the ER this morning when I called to let him know his appointment is to be changes virtual.  He agreed to come to this appointment and left the emergency room without being seen  Note patient does have improvement after right inguinal hernia has been repaired.  02/25/23 Patient seen in return follow-up he complains of decreased hearing in the left ear and continued pain in the left lower jaw he has had a little dental abscess left lower jaw in the past just finished a course of antibiotics.  He needs to see an oral surgeon as he has multiple dental caries and teeth need to be pulled there are no other complaints.  On arrival blood pressure 114/74.  06/30/23        Review of Systems  Constitutional:  Negative for chills, diaphoresis, fever, malaise/fatigue and weight loss.  HENT:  Negative for congestion, hearing loss, nosebleeds, sore throat and tinnitus.        Dental pain  Eyes:  Negative for blurred vision, photophobia and redness.  Respiratory:  Negative for cough, hemoptysis, sputum production, shortness of breath, wheezing and stridor.   Cardiovascular:  Negative for chest pain, palpitations, orthopnea, claudication, leg swelling and PND.  Gastrointestinal:  Negative for abdominal pain, blood in stool, constipation, diarrhea, heartburn, nausea  and vomiting.  Genitourinary:  Negative for dysuria, flank pain, frequency, hematuria and urgency.  Musculoskeletal:  Negative for back pain, falls, joint pain, myalgias and neck pain.  Skin:  Negative for itching and rash.  Neurological:  Negative for dizziness, tingling, tremors, sensory change, speech change, focal weakness, seizures, loss of consciousness, weakness and headaches.  Endo/Heme/Allergies:  Negative for environmental allergies and polydipsia. Does not bruise/bleed easily.  Psychiatric/Behavioral:  Negative for depression, memory loss, substance abuse and suicidal ideas. The patient is not nervous/anxious and does not have insomnia.       Objective:     There were no vitals taken for this visit.  No exam this is a telephone visit Physical Exam Vitals reviewed.  Constitutional:      Appearance: Normal appearance. He is well-developed. He is not diaphoretic.  HENT:     Head: Normocephalic and atraumatic.     Right Ear: Tympanic membrane, ear canal and external ear normal. There is no impacted cerumen.     Left Ear: External ear normal. There is impacted cerumen.     Nose: No nasal deformity, septal deviation, mucosal edema or rhinorrhea.     Right Sinus: No maxillary sinus tenderness or frontal sinus tenderness.     Left Sinus: No maxillary sinus tenderness or frontal sinus tenderness.     Mouth/Throat:     Mouth: Mucous membranes are moist.     Pharynx: Oropharynx is clear. No oropharyngeal  exudate.     Comments: Multiple dental caries disarray in the left anterior and lower jaw previous abscess seen still erythema around the posterior molar left lower jaw Eyes:     General: No scleral icterus.    Conjunctiva/sclera: Conjunctivae normal.     Pupils: Pupils are equal, round, and reactive to light.  Neck:     Thyroid: No thyromegaly.     Vascular: No carotid bruit or JVD.     Trachea: Trachea normal. No tracheal tenderness or tracheal deviation.  Cardiovascular:      Rate and Rhythm: Normal rate and regular rhythm.     Chest Wall: PMI is not displaced.     Pulses: Normal pulses. No decreased pulses.     Heart sounds: Normal heart sounds, S1 normal and S2 normal. Heart sounds not distant. No murmur heard.    No systolic murmur is present.     No diastolic murmur is present.     No friction rub. No gallop. No S3 or S4 sounds.  Pulmonary:     Effort: No tachypnea, accessory muscle usage or respiratory distress.     Breath sounds: No stridor. No decreased breath sounds, wheezing, rhonchi or rales.  Chest:     Chest wall: No tenderness.  Abdominal:     General: Bowel sounds are normal. There is no distension.     Palpations: Abdomen is soft. Abdomen is not rigid.     Tenderness: There is no abdominal tenderness. There is no guarding or rebound.  Musculoskeletal:        General: Normal range of motion.     Cervical back: Normal range of motion and neck supple. No edema, erythema or rigidity. No muscular tenderness. Normal range of motion.  Lymphadenopathy:     Head:     Right side of head: No submental or submandibular adenopathy.     Left side of head: No submental or submandibular adenopathy.     Cervical: No cervical adenopathy.  Skin:    General: Skin is warm and dry.     Coloration: Skin is not pale.     Findings: No rash.     Nails: There is no clubbing.  Neurological:     Mental Status: He is alert and oriented to person, place, and time.     Sensory: No sensory deficit.  Psychiatric:        Speech: Speech normal.        Behavior: Behavior normal.     No results found for any visits on 06/30/23.    The ASCVD Risk score (Arnett DK, et al., 2019) failed to calculate for the following reasons:   Cannot find a previous HDL lab   Cannot find a previous total cholesterol lab    Assessment & Plan:   Problem List Items Addressed This Visit   None    Shan Levans, MD

## 2023-12-01 ENCOUNTER — Ambulatory Visit: Payer: BLUE CROSS/BLUE SHIELD | Admitting: Critical Care Medicine

## 2023-12-05 NOTE — Progress Notes (Deleted)
Established Patient Office Visit  Subjective   Patient ID: Thomas Holt, male    DOB: 07/27/1971  Age: 53 y.o. MRN: [id number]  CC: Dental pain  02/03/23 53 y.o. male pcp pt of Dr. 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  He is yet to achieve dental appointment.  He now has [employer] and did buy a Glass blower/designer.  The patient is willing to see a dentist referral will need to be made.  He was in the ER this morning when I called to let him know his appointment is to be changes virtual.  He agreed to come to this appointment and left the emergency room without being seen  Note patient does have improvement after right inguinal hernia has been repaired.  02/25/23 Patient seen in return follow-up he complains of decreased hearing in the left ear and continued pain in the left lower jaw he has had a little dental abscess left lower jaw in the past just finished a course of antibiotics.  He needs to see an oral surgeon as he has multiple dental caries and teeth need to be pulled there are no other complaints.  On arrival blood pressure 114/74.   12/09/23         Review of Systems  Constitutional:  Negative for chills, diaphoresis, fever, malaise/fatigue and weight loss.  HENT:  Negative for congestion, hearing loss, nosebleeds, sore throat and tinnitus.        Dental pain  Eyes:  Negative for blurred vision, photophobia and redness.  Respiratory:  Negative for cough, hemoptysis, sputum production, shortness of breath, wheezing and stridor.   Cardiovascular:  Negative for chest pain, palpitations, orthopnea, claudication, leg swelling and PND.  Gastrointestinal:  Negative for abdominal pain, blood in stool, constipation, diarrhea, heartburn,  nausea and vomiting.  Genitourinary:  Negative for dysuria, flank pain, frequency, hematuria and urgency.  Musculoskeletal:  Negative for back pain, falls, joint pain, myalgias and neck pain.  Skin:  Negative for itching and rash.  Neurological:  Negative for dizziness, tingling, tremors, sensory change, speech change, focal weakness, seizures, loss of consciousness, weakness and headaches.  Endo/Heme/Allergies:  Negative for environmental allergies and polydipsia. Does not bruise/bleed easily.  Psychiatric/Behavioral:  Negative for depression, memory loss, substance abuse and suicidal ideas. The patient is not nervous/anxious and does not have insomnia.       Objective:     There were no vitals taken for this visit.  No exam this is a telephone visit Physical Exam Vitals reviewed.  Constitutional:      Appearance: Normal appearance. He is well-developed. He is not diaphoretic.  HENT:     Head: Normocephalic and atraumatic.     Right Ear: Tympanic membrane, ear canal and external ear normal. There is no impacted cerumen.     Left Ear: External ear normal. There is impacted cerumen.     Nose: No nasal deformity, septal deviation, mucosal edema or rhinorrhea.     Right Sinus: No maxillary sinus tenderness or frontal sinus tenderness.     Left Sinus: No maxillary sinus tenderness or frontal sinus tenderness.     Mouth/Throat:     Mouth: Mucous membranes are moist.     Pharynx: Oropharynx is clear.  No oropharyngeal exudate.     Comments: Multiple dental caries disarray in the left anterior and lower jaw previous abscess seen still erythema around the posterior molar left lower jaw Eyes:     General: No scleral icterus.    Conjunctiva/sclera: Conjunctivae normal.     Pupils: Pupils are equal, round, and reactive to light.  Neck:     Thyroid: No thyromegaly.     Vascular: No carotid bruit or JVD.     Trachea: Trachea normal. No tracheal tenderness or tracheal deviation.   Cardiovascular:     Rate and Rhythm: Normal rate and regular rhythm.     Chest Wall: PMI is not displaced.     Pulses: Normal pulses. No decreased pulses.     Heart sounds: Normal heart sounds, S1 normal and S2 normal. Heart sounds not distant. No murmur heard.    No systolic murmur is present.     No diastolic murmur is present.     No friction rub. No gallop. No S3 or S4 sounds.  Pulmonary:     Effort: No tachypnea, accessory muscle usage or respiratory distress.     Breath sounds: No stridor. No decreased breath sounds, wheezing, rhonchi or rales.  Chest:     Chest wall: No tenderness.  Abdominal:     General: Bowel sounds are normal. There is no distension.     Palpations: Abdomen is soft. Abdomen is not rigid.     Tenderness: There is no abdominal tenderness. There is no guarding or rebound.  Musculoskeletal:        General: Normal range of motion.     Cervical back: Normal range of motion and neck supple. No edema, erythema or rigidity. No muscular tenderness. Normal range of motion.  Lymphadenopathy:     Head:     Right side of head: No submental or submandibular adenopathy.     Left side of head: No submental or submandibular adenopathy.     Cervical: No cervical adenopathy.  Skin:    General: Skin is warm and dry.     Coloration: Skin is not pale.     Findings: No rash.     Nails: There is no clubbing.  Neurological:     Mental Status: He is alert and oriented to person, place, and time.     Sensory: No sensory deficit.  Psychiatric:        Speech: Speech normal.        Behavior: Behavior normal.     No results found for any visits on 12/09/23.    The ASCVD Risk score (Arnett DK, et al., 2019) failed to calculate for the following reasons:   Cannot find a previous HDL lab   Cannot find a previous total cholesterol lab    Assessment & Plan:   Problem List Items Addressed This Visit   None    Shan Levans, MD

## 2023-12-09 ENCOUNTER — Ambulatory Visit: Payer: Medicaid Other | Admitting: Critical Care Medicine

## 2024-02-18 ENCOUNTER — Encounter (HOSPITAL_COMMUNITY): Payer: Self-pay

## 2024-02-18 ENCOUNTER — Ambulatory Visit (HOSPITAL_COMMUNITY): Admission: EM | Admit: 2024-02-18 | Discharge: 2024-02-18 | Disposition: A | Discharge status: Home / Self Care

## 2024-02-18 DIAGNOSIS — R531 Weakness: Secondary | ICD-10-CM

## 2024-02-18 LAB — POCT FASTING CBG KUC MANUAL ENTRY: POCT Glucose (KUC): 105 mg/dL — AB (ref 70–99)

## 2024-02-18 NOTE — ED Provider Notes (Signed)
 [facility]  Note:  This document was prepared using Dragon voice recognition software and may include unintentional dictation errors.  MRN: [id number] DOB: [birth year]  Subjective:   Thomas Holt is a 53 y.o. male presenting for fatigue this morning after waking up.  Patient reports that he woke up this morning and felt extremely tired and could not get out of bed.  After getting up and eating food he felt better.  The patient denies seeing his primary care provider for the last couple of years.  Patient states he either has a history of high blood pressure or high blood sugar, but does not remember which.  Patient not currently taking any medications to treat blood pressure or diabetes.  Patient denies any current symptoms of dizziness, weakness, shortness of breath, chest pain.  No current facility-administered medications for this encounter.  Current Outpatient Medications:    amoxicillin-clavulanate (AUGMENTIN) 875-125 MG tablet, Take 1 tablet by mouth 2 (two) times daily., Disp: 20 tablet, Rfl: 0   carbamide peroxide (DEBROX) 6.5 % OTIC solution, Place 5 drops into the left ear 2 (two) times daily., Disp: 15 mL, Rfl: 0   ibuprofen (ADVIL) 600 MG tablet, Take 1 tablet (600 mg total) by mouth every 8 (eight) hours as needed., Disp: 30 tablet, Rfl: 0   No Known Allergies  Past Medical History:  Diagnosis Date   Hyperglycemia 03/11/2022   Pneumonia      Past Surgical History:  Procedure Laterality Date   APPENDECTOMY     INGUINAL HERNIA REPAIR Bilateral 08/06/2022   Procedure: OPEN BILATERAL INGUINAL HERNIA REPAIR WITH MESH;  Surgeon: Harriette Bouillon, MD;  Location: [facility] OR;  Service: General;  Laterality: Bilateral;   INSERTION OF MESH Bilateral 08/06/2022   Procedure: INSERTION OF MESH;  Surgeon: Harriette Bouillon, MD;  Location: [facility] OR;  Service: General;  Laterality: Bilateral;   SKIN GRAFT Left    from Left leg grafted to Right foot   UMBILICAL HERNIA REPAIR N/A  08/06/2022   Procedure: OPEN UMBILICAL HERNIA REPAIR;  Surgeon: Harriette Bouillon, MD;  Location: [facility] OR;  Service: General;  Laterality: N/A;    Family History  Problem Relation Age of Onset   Cancer Mother    Heart disease Father     Social History   Tobacco Use   Smoking status: Never   Smokeless tobacco: Never  Vaping Use   Vaping status: Never Used  Substance Use Topics   Alcohol use: No   Drug use: No    ROS Refer to HPI for ROS details.  Objective:   Vitals: BP 105/73 (BP Location: Left Arm)   Pulse 72   Temp 98.3 F (36.8 C) (Oral)   Resp 16   Ht 5\' 11"  (1.803 m)   Wt 216 lb (98 kg)   SpO2 96%   BMI 30.13 kg/m   Physical Exam Vitals and nursing note reviewed.  Constitutional:      General: He is not in acute distress.    Appearance: Normal appearance. He is well-developed. He is not ill-appearing or toxic-appearing.  HENT:     Head: Normocephalic.  Cardiovascular:     Rate and Rhythm: Normal rate.  Pulmonary:     Effort: Pulmonary effort is normal. No respiratory distress.  Skin:    General: Skin is warm and dry.     Capillary Refill: Capillary refill takes less than 2 seconds.  Neurological:     General: No focal deficit present.     Mental Status:  He is alert and oriented to person, place, and time.     Cranial Nerves: No cranial nerve deficit.  Psychiatric:        Mood and Affect: Mood normal.        Behavior: Behavior normal.     Procedures  Results for orders placed or performed during the hospital encounter of 02/18/24 (from the past 24 hours)  POC CBG monitoring     Status: Abnormal   Collection Time: 02/18/24 11:18 AM  Result Value Ref Range   POCT Glucose ([facility]) 105 (A) 70 - 99 mg/dL    Assessment and Plan :   PDMP not reviewed this encounter.  1. Generalized weakness    -Blood glucose evaluated in UC is 105 mg/dL, no hyperglycemia or hypoglycemia. -Continue to monitor symptoms for any change in severity there is any  escalation of current symptoms or development of new symptoms follow-up for further evaluation and management. -Otherwise follow-up on Tuesday for primary care provider visit and routine health maintenance check.  L[place]boy   [place], [place], [place] 02/18/24 1138

## 2024-02-18 NOTE — ED Triage Notes (Signed)
 Patient presenting with weakness/fatigue onset this morning. States no history of high blood sugar or pressure. Patient states he had not ate yet. States this has not happened before.  Prescriptions or OTC medications tried: No

## 2024-02-22 ENCOUNTER — Ambulatory Visit: Attending: Family Medicine | Admitting: Family Medicine

## 2024-03-08 ENCOUNTER — Encounter: Payer: Self-pay | Admitting: Nurse Practitioner

## 2024-03-08 ENCOUNTER — Ambulatory Visit: Attending: Nurse Practitioner | Admitting: Nurse Practitioner

## 2024-03-08 VITALS — BP 108/68 | HR 75 | Resp 20 | Ht 71.0 in | Wt 217.0 lb

## 2024-03-08 DIAGNOSIS — R7309 Other abnormal glucose: Secondary | ICD-10-CM | POA: Diagnosis not present

## 2024-03-08 DIAGNOSIS — E7800 Pure hypercholesterolemia, unspecified: Secondary | ICD-10-CM

## 2024-03-08 DIAGNOSIS — K089 Disorder of teeth and supporting structures, unspecified: Secondary | ICD-10-CM

## 2024-03-08 DIAGNOSIS — Z1211 Encounter for screening for malignant neoplasm of colon: Secondary | ICD-10-CM

## 2024-03-08 MED ORDER — CHLORHEXIDINE GLUCONATE 0.12 % MT SOLN: 15.0000 mL | Freq: Two times a day (BID) | OROMUCOSAL | 1 refills | Status: AC

## 2024-03-08 NOTE — Progress Notes (Signed)
 Assessment & Plan:  Thomas Holt was seen today for medical management of chronic issues.  Diagnoses and all orders for this visit:  Poor dentition -     chlorhexidine (PERIDEX) 0.12 % solution; Use as directed 15 mLs in the mouth or throat 2 (two) times daily.  Elevated glucose -     CMP14+EGFR -     CBC with Differential -     Hemoglobin A1c  Pure hypercholesterolemia -     Lipid panel  Colon cancer screening -     Ambulatory referral to Gastroenterology    Patient has been counseled on age-appropriate routine health concerns for screening and prevention. These are reviewed and up-to-date. Referrals have been placed accordingly. Immunizations are up-to-date or declined.    Subjective:   Chief Complaint  Patient presents with   Medical Management of Chronic Issues    Thomas Holt 53 y.o. male presents to office today to establish care with a new provider. His previous provider Dr. Delford Field is no longer with this clinic full time.   He has a past medical history of Hyperglycemia (03/11/2022) and Pneumonia.   He is requesting prophylactic abx for poor dentition. On exam today there is no evidence of dental abscess. He does have several fracture teeth and decay but no signs of infection.    Review of Systems  Constitutional:  Negative for fever, malaise/fatigue and weight loss.       POOR DENTITION  HENT: Negative.  Negative for nosebleeds.   Eyes: Negative.  Negative for blurred vision, double vision and photophobia.  Respiratory: Negative.  Negative for cough and shortness of breath.   Cardiovascular: Negative.  Negative for chest pain, palpitations and leg swelling.  Gastrointestinal: Negative.  Negative for heartburn, nausea and vomiting.  Musculoskeletal: Negative.  Negative for myalgias.  Neurological: Negative.  Negative for dizziness, focal weakness, seizures and headaches.  Psychiatric/Behavioral: Negative.  Negative for suicidal ideas.     Past Medical History:   Diagnosis Date   Hyperglycemia 03/11/2022   Pneumonia     Past Surgical History:  Procedure Laterality Date   APPENDECTOMY     INGUINAL HERNIA REPAIR Bilateral 08/06/2022   Procedure: OPEN BILATERAL INGUINAL HERNIA REPAIR WITH MESH;  Surgeon: Harriette Bouillon, MD;  Location: [facility];  Service: General;  Laterality: Bilateral;   INSERTION OF MESH Bilateral 08/06/2022   Procedure: INSERTION OF MESH;  Surgeon: Harriette Bouillon, MD;  Location: [facility];  Service: General;  Laterality: Bilateral;   SKIN GRAFT Left    from Left leg grafted to Right foot   UMBILICAL HERNIA REPAIR N/A 08/06/2022   Procedure: OPEN UMBILICAL HERNIA REPAIR;  Surgeon: Harriette Bouillon, MD;  Location: [facility];  Service: General;  Laterality: N/A;    Family History  Problem Relation Age of Onset   Cancer Mother    Heart disease Father     Social History Reviewed with no changes to be made today.   Outpatient Medications Prior to Visit  Medication Sig Dispense Refill   amoxicillin-clavulanate (AUGMENTIN) 875-125 MG tablet Take 1 tablet by mouth 2 (two) times daily. (Patient not taking: Reported on 03/08/2024) 20 tablet 0   carbamide peroxide (DEBROX) 6.5 % OTIC solution Place 5 drops into the left ear 2 (two) times daily. (Patient not taking: Reported on 03/08/2024) 15 mL 0   ibuprofen (ADVIL) 600 MG tablet Take 1 tablet (600 mg total) by mouth every 8 (eight) hours as needed. (Patient not taking: Reported on 03/08/2024) 30 tablet 0  No facility-administered medications prior to visit.    No Known Allergies     Objective:    BP 108/68 (BP Location: Left Arm, Patient Position: Sitting, Cuff Size: Normal)   Pulse 75   Resp 20   Ht 5\' 11"  (1.803 m)   Wt 217 lb (98.4 kg)   SpO2 100%   BMI 30.27 kg/m  Wt Readings from Last 3 Encounters:  03/08/24 217 lb (98.4 kg)  02/18/24 216 lb (98 kg)  02/25/23 199 lb 3.2 oz (90.4 kg)    Physical Exam Vitals and nursing note reviewed.  Constitutional:      Appearance: He is  well-developed.  HENT:     Head: Normocephalic and atraumatic.     Mouth/Throat:     Dentition: Abnormal dentition. Does not have dentures. Dental caries present. No dental tenderness, gingival swelling, dental abscesses or gum lesions.  Cardiovascular:     Rate and Rhythm: Normal rate and regular rhythm.     Heart sounds: Normal heart sounds. No murmur heard.    No friction rub. No gallop.  Pulmonary:     Effort: Pulmonary effort is normal. No tachypnea or respiratory distress.     Breath sounds: Normal breath sounds. No decreased breath sounds, wheezing, rhonchi or rales.  Chest:     Chest wall: No tenderness.  Abdominal:     General: Bowel sounds are normal.     Palpations: Abdomen is soft.  Musculoskeletal:        General: Normal range of motion.     Cervical back: Normal range of motion.  Skin:    General: Skin is warm and dry.  Neurological:     Mental Status: He is alert and oriented to person, place, and time.     Coordination: Coordination normal.  Psychiatric:        Behavior: Behavior normal. Behavior is cooperative.        Thought Content: Thought content normal.        Judgment: Judgment normal.          Patient has been counseled extensively about nutrition and exercise as well as the importance of adherence with medications and regular follow-up. The patient was given clear instructions to go to ER or return to medical center if symptoms don't improve, worsen or new problems develop. The patient verbalized understanding.   Follow-up: Return in about 5 months (around 08/08/2024) for PHYSICAL.   Collins Dean, FNP-BC [facility] [place], [place] [phone]   03/08/2024, 5:26 PM

## 2024-03-09 LAB — CMP14+EGFR
ALT: 23 IU/L (ref 0–44)
AST: 26 IU/L (ref 0–40)
Albumin: 4.3 g/dL (ref 3.8–4.9)
Alkaline Phosphatase: 68 IU/L (ref 44–121)
BUN/Creatinine Ratio: 13 (ref 9–20)
BUN: 12 mg/dL (ref 6–24)
Bilirubin Total: 0.7 mg/dL (ref 0.0–1.2)
CO2: 27 mmol/L (ref 20–29)
Calcium: 9.1 mg/dL (ref 8.7–10.2)
Chloride: 106 mmol/L (ref 96–106)
Creatinine, Ser: 0.91 mg/dL (ref 0.76–1.27)
Globulin, Total: 3.1 g/dL (ref 1.5–4.5)
Glucose: 94 mg/dL (ref 70–99)
Potassium: 4.5 mmol/L (ref 3.5–5.2)
Sodium: 145 mmol/L — ABNORMAL HIGH (ref 134–144)
Total Protein: 7.4 g/dL (ref 6.0–8.5)
eGFR: 101 mL/min/{1.73_m2} (ref >59)

## 2024-03-09 LAB — CBC WITH DIFFERENTIAL/PLATELET
Basophils Absolute: 0 10*3/uL (ref 0.0–0.2)
Basos: 0 %
EOS (ABSOLUTE): 0.1 10*3/uL (ref 0.0–0.4)
Eos: 1 %
Hematocrit: 46.3 % (ref 37.5–51.0)
Hemoglobin: 15.1 g/dL (ref 13.0–17.7)
Immature Grans (Abs): 0 10*3/uL (ref 0.0–0.1)
Immature Granulocytes: 0 %
Lymphocytes Absolute: 2.2 10*3/uL (ref 0.7–3.1)
Lymphs: 23 %
MCH: 29.8 pg (ref 26.6–33.0)
MCHC: 32.6 g/dL (ref 31.5–35.7)
MCV: 92 fL (ref 79–97)
Monocytes Absolute: 0.5 10*3/uL (ref 0.1–0.9)
Monocytes: 5 %
Neutrophils Absolute: 6.6 10*3/uL (ref 1.4–7.0)
Neutrophils: 71 %
Platelets: 196 10*3/uL (ref 150–450)
RBC: 5.06 x10E6/uL (ref 4.14–5.80)
RDW: 11.6 % (ref 11.6–15.4)
WBC: 9.5 10*3/uL (ref 3.4–10.8)

## 2024-03-09 LAB — HEMOGLOBIN A1C
Est. average glucose Bld gHb Est-mCnc: 100 mg/dL
Hgb A1c MFr Bld: 5.1 % (ref 4.8–5.6)

## 2024-03-09 LAB — LIPID PANEL
Chol/HDL Ratio: 2.8 ratio (ref 0.0–5.0)
Cholesterol, Total: 145 mg/dL (ref 100–199)
HDL: 52 mg/dL (ref >39)
LDL Chol Calc (NIH): 78 mg/dL (ref 0–99)
Triglycerides: 78 mg/dL (ref 0–149)
VLDL Cholesterol Cal: 15 mg/dL (ref 5–40)

## 2024-03-14 ENCOUNTER — Encounter: Payer: Self-pay | Admitting: Nurse Practitioner

## 2024-04-24 ENCOUNTER — Encounter: Payer: Self-pay | Admitting: Nurse Practitioner

## 2024-05-31 ENCOUNTER — Emergency Department (HOSPITAL_COMMUNITY)
Admission: EM | Admit: 2024-05-31 | Discharge: 2024-05-31 | Disposition: A | Discharge status: Home / Self Care | Attending: Emergency Medicine | Admitting: Emergency Medicine

## 2024-05-31 ENCOUNTER — Other Ambulatory Visit: Payer: Self-pay

## 2024-05-31 ENCOUNTER — Emergency Department (HOSPITAL_COMMUNITY)

## 2024-05-31 DIAGNOSIS — R0789 Other chest pain: Secondary | ICD-10-CM | POA: Diagnosis not present

## 2024-05-31 DIAGNOSIS — R079 Chest pain, unspecified: Secondary | ICD-10-CM | POA: Diagnosis not present

## 2024-05-31 DIAGNOSIS — R0989 Other specified symptoms and signs involving the circulatory and respiratory systems: Secondary | ICD-10-CM | POA: Diagnosis not present

## 2024-05-31 DIAGNOSIS — R0782 Intercostal pain: Secondary | ICD-10-CM | POA: Diagnosis not present

## 2024-05-31 LAB — CBC WITH DIFFERENTIAL/PLATELET
Abs Immature Granulocytes: 0.03 K/uL (ref 0.00–0.07)
Basophils Absolute: 0 K/uL (ref 0.0–0.1)
Basophils Relative: 0 %
Eosinophils Absolute: 0 K/uL (ref 0.0–0.5)
Eosinophils Relative: 0 %
HCT: 44 % (ref 39.0–52.0)
Hemoglobin: 14.3 g/dL (ref 13.0–17.0)
Immature Granulocytes: 0 %
Lymphocytes Relative: 27 %
Lymphs Abs: 2.5 K/uL (ref 0.7–4.0)
MCH: 30.3 pg (ref 26.0–34.0)
MCHC: 32.5 g/dL (ref 30.0–36.0)
MCV: 93.2 fL (ref 80.0–100.0)
Monocytes Absolute: 0.6 K/uL (ref 0.1–1.0)
Monocytes Relative: 6 %
Neutro Abs: 6.1 K/uL (ref 1.7–7.7)
Neutrophils Relative %: 67 %
Platelets: 144 K/uL — ABNORMAL LOW (ref 150–400)
RBC: 4.72 MIL/uL (ref 4.22–5.81)
RDW: 11.8 % (ref 11.5–15.5)
WBC: 9.3 K/uL (ref 4.0–10.5)
nRBC: 0 % (ref 0.0–0.2)

## 2024-05-31 LAB — BASIC METABOLIC PANEL WITH GFR
Anion gap: 12 (ref 5–15)
BUN: 18 mg/dL (ref 6–20)
CO2: 23 mmol/L (ref 22–32)
Calcium: 9 mg/dL (ref 8.9–10.3)
Chloride: 106 mmol/L (ref 98–111)
Creatinine, Ser: 1 mg/dL (ref 0.61–1.24)
GFR, Estimated: >60 mL/min (ref >60)
Glucose, Bld: 93 mg/dL (ref 70–99)
Potassium: 3.6 mmol/L (ref 3.5–5.1)
Sodium: 141 mmol/L (ref 135–145)

## 2024-05-31 LAB — TROPONIN I (HIGH SENSITIVITY)
Troponin I (High Sensitivity): 4 ng/L (ref <18)
Troponin I (High Sensitivity): 4 ng/L (ref <18)

## 2024-05-31 NOTE — ED Triage Notes (Signed)
 Pt bibems from [employer]. Complaining of sharp pain left chest 7/10. No cardiac hx. Denies any dizziness, headache, or shob. HR 54 118/72 96% on RA CBG 90

## 2024-05-31 NOTE — ED Provider Notes (Signed)
  EMERGENCY DEPARTMENT AT [facility] Provider Note   CSN: [id number] Arrival date & time: 05/31/24  9156     Patient presents with: Chest Pain   Thomas Holt is a 53 y.o. male.   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  He reports that he was just standing there when he got a sudden sharp hard pain in the left side of his chest that was not worse with inspiration, movement or positioning.  He reports it lasted 5 to 10 minutes and then resolved spontaneously.  During the episode he denied any diaphoresis, nausea, vomiting, presyncope, shortness of breath.  He denies feeling unwell recently he has not had congestion, cough or other complaints.  He denies any abdominal pain.  Currently he reports feeling his normal self.  He denies any unilateral leg pain or swelling or recent immobilization.  He takes no medications denies tobacco, alcohol or drug use.  No prior history of significant medical issues and he does see his doctor regularly.  Does have a family history of his dad with cardiac disease at 89 passing away from a heart attack per him.  The history is provided by the patient and medical records.  Chest Pain      Prior to Admission medications   Medication Sig Start Date End Date Taking? Authorizing Provider  chlorhexidine  (PERIDEX ) 0.12 % solution Use as directed 15 mLs in the mouth or throat 2 (two) times daily. 03/08/24   Fleming, Zelda W, NP    Allergies: Patient has no known allergies.    Review of Systems  Cardiovascular:  Positive for chest pain.    Updated Vital Signs BP (!) 137/98   Pulse 71   Temp (!) 97.5 F (36.4 C) (Oral)   Resp 14   Ht 5' 11 (1.803 m)   Wt 98 kg   SpO2 100%   BMI 30.13 kg/m   Physical Exam Vitals and nursing note reviewed.   Constitutional:      General: He is not in acute distress.    Appearance: He is well-developed.  HENT:     Head: Normocephalic and atraumatic.  Eyes:     Conjunctiva/sclera: Conjunctivae normal.     Pupils: Pupils are equal, round, and reactive to light.  Cardiovascular:     Rate and Rhythm: Normal rate and regular rhythm.     Pulses: Normal pulses.     Heart sounds: No murmur heard. Pulmonary:     Effort: Pulmonary effort is normal. No respiratory distress.     Breath sounds: Normal breath sounds. No wheezing or rales.  Abdominal:     General: There is no distension.     Palpations: Abdomen is soft.     Tenderness: There is no abdominal tenderness. There is no guarding or rebound.  Musculoskeletal:        General: No tenderness. Normal range of motion.     Cervical back: Normal range of motion and neck supple.     Right lower leg: No edema.     Left lower leg: No edema.  Skin:    General: Skin is warm and dry.     Findings: No erythema or rash.  Neurological:     Mental Status: He is alert and oriented to person, place, and time.  Psychiatric:  Behavior: Behavior normal.     (all labs ordered are listed, but only abnormal results are displayed) Labs Reviewed  CBC WITH DIFFERENTIAL/PLATELET - Abnormal; Notable for the following components:      Result Value   Platelets 144 (*)    All other components within normal limits  BASIC METABOLIC PANEL WITH GFR  TROPONIN I (HIGH SENSITIVITY)  TROPONIN I (HIGH SENSITIVITY)    EKG: EKG Interpretation Date/Time:  Wednesday May 31 2024 08:50:02 EDT Ventricular Rate:  56 PR Interval:  184 QRS Duration:  94 QT Interval:  414 QTC Calculation: 400 R Axis:   -3  Text Interpretation: Sinus rhythm Abnormal R-wave progression, early transition No significant change since last tracing Confirmed by Doretha Folks ([id number]) on 05/31/2024 9:11:01 AM  Radiology: [facility] Chest Port 1 View Result Date: 05/31/2024 CLINICAL DATA:   Chest pain. EXAM: PORTABLE CHEST 1 VIEW COMPARISON:  06/20/2022 FINDINGS: The heart size and mediastinal contours are within normal limits. Low lung volumes. No focal consolidation, pleural effusion, or pneumothorax. No acute osseous abnormality. IMPRESSION: Low lung volumes.  No acute cardiopulmonary findings. Electronically Signed   By: Harrietta Sherry M.D.   On: 05/31/2024 10:04     Procedures   Medications Ordered in the ED - No data to display                                  Medical Decision Making Amount and/or Complexity of Data Reviewed Independent Historian: EMS External Data Reviewed: notes. Labs: ordered. Decision-making details documented in ED Course. Radiology: ordered and independent interpretation performed. Decision-making details documented in ED Course. ECG/medicine tests: ordered and independent interpretation performed. Decision-making details documented in ED Course.   Pt with multiple medical problems and comorbidities and presenting today with a complaint that caries a high risk for morbidity and mortality.  Here today with complaint of chest pain that is now resolved.  Patient is well-appearing here without acute findings.  Vital signs are reassuring.  Will rule out ACS but also could be musculoskeletal in nature.  Low suspicion for PE, PTX or PNA and patient is low risk [place].  Low suspicion for dissection or acute abdominal pathology.  I independently interpreted patient's EKG and labs.  EKG is within normal limits.  CBC, troponin x 2, BMP within normal limits.  I have independently visualized and interpreted pt's images today.  CXR wnl.  At this time low suspicion for ACS.  Feel most likely musculoskeletal.  Patient discharged home in good condition.      Final diagnoses:  Chest wall pain    ED Discharge Orders     None          Doretha Folks, MD 05/31/24 1531

## 2024-05-31 NOTE — Discharge Instructions (Addendum)
 Everything with your heart and lungs looks good today.  You can take Tylenol  or ibuprofen  if the pain returns.  However if you start having shortness of breath, passing out, abdominal pain or vomiting return to the emergency room.

## 2024-05-31 NOTE — ED Notes (Signed)
 X-ray at bedside

## 2024-06-12 ENCOUNTER — Other Ambulatory Visit: Payer: Self-pay

## 2024-06-12 ENCOUNTER — Emergency Department (HOSPITAL_COMMUNITY)
Admission: EM | Admit: 2024-06-12 | Discharge: 2024-06-12 | Disposition: A | Discharge status: Home / Self Care | Attending: Emergency Medicine | Admitting: Emergency Medicine

## 2024-06-12 ENCOUNTER — Encounter (HOSPITAL_COMMUNITY): Payer: Self-pay

## 2024-06-12 DIAGNOSIS — R6884 Jaw pain: Secondary | ICD-10-CM | POA: Diagnosis present

## 2024-06-12 DIAGNOSIS — K0889 Other specified disorders of teeth and supporting structures: Secondary | ICD-10-CM | POA: Diagnosis not present

## 2024-06-12 MED ORDER — ETODOLAC 400 MG PO TABS: 400.0000 mg | ORAL_TABLET | Freq: Two times a day (BID) | ORAL | 0 refills | Status: AC

## 2024-06-12 MED ORDER — AMOXICILLIN 500 MG PO CAPS: 500.0000 mg | ORAL_CAPSULE | Freq: Three times a day (TID) | ORAL | 0 refills | Status: AC

## 2024-06-12 MED ORDER — KETOROLAC TROMETHAMINE 15 MG/ML IJ SOLN
15.0000 mg | Freq: Once | INTRAMUSCULAR | Status: AC
  Administered 2024-06-12: 15 mg via INTRAMUSCULAR
  Filled 2024-06-12: qty 1

## 2024-06-12 NOTE — ED Triage Notes (Signed)
 Pt coming in with pain in his left jaw. Pt reports he has been trying ot get in to see a dentist. Pt reports that this pain started 3 days ago.

## 2024-06-12 NOTE — ED Provider Notes (Signed)
 [place] EMERGENCY DEPARTMENT AT [facility] Provider Note   CSN: [id number] Arrival date & time: 06/12/24  9385     Patient presents with: Jaw Pain   Thomas Holt is a 53 y.o. male.   52 year old male presents today for concern of dental pain.  No fever, denies any drainage.  No external facial swelling.  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  No difficulty swallowing.  The history is provided by the patient. No language interpreter was used.       Prior to Admission medications   Medication Sig Start Date End Date Taking? Authorizing Provider  amoxicillin  (AMOXIL ) 500 MG capsule Take 1 capsule (500 mg total) by mouth 3 (three) times daily. 06/12/24  Yes Miguelangel Korn, PA-C  etodolac  (LODINE ) 400 MG tablet Take 1 tablet (400 mg total) by mouth 2 (two) times daily. 06/12/24  Yes Shakila Mak, PA-C  chlorhexidine  (PERIDEX ) 0.12 % solution Use as directed 15 mLs in the mouth or throat 2 (two) times daily. 03/08/24   Fleming, Zelda W, NP    Allergies: Patient has no known allergies.    Review of Systems  Constitutional:  Negative for fever.  HENT:  Positive for dental problem. Negative for drooling and trouble swallowing.   All other systems reviewed and are negative.   Updated Vital Signs BP (!) 139/99 (BP Location: Right Arm)   Pulse (!) 104   Temp 97.9 F (36.6 C) (Oral)   Resp 16   Ht 5' 11 (1.803 m)   Wt 98 kg   SpO2 95%   BMI 30.13 kg/m   Physical Exam Vitals and nursing note reviewed.  Constitutional:      General: He is not in acute distress.    Appearance: Normal appearance. He is not ill-appearing.  HENT:     Head: Normocephalic and atraumatic.     Nose: Nose normal.     Mouth/Throat:     Mouth: Mucous membranes are moist.     Pharynx: No oropharyngeal exudate or posterior oropharyngeal erythema.     Comments: Evidence of poor dentition.  No evidence of dental abscess, peritonsillar abscess, retropharyngeal abscess, or Ludwig's angina. Eyes:      Conjunctiva/sclera: Conjunctivae normal.  Pulmonary:     Effort: Pulmonary effort is normal. No respiratory distress.  Musculoskeletal:        General: No deformity.  Skin:    Findings: No rash.  Neurological:     Mental Status: He is alert.     (all labs ordered are listed, but only abnormal results are displayed) Labs Reviewed - No data to display  EKG: None  Radiology: No results found.   Procedures   Medications Ordered in the ED  ketorolac  (TORADOL ) 15 MG/ML injection 15 mg (has no administration in time range)                                    Medical Decision Making Risk Prescription drug management.   53 year old male presents today for concern of dental pain.  No fever, drainage.  Evidence of poor dentition on exam.  No retropharyngeal abscess, peritonsillar abscess, Duda extension, or dental abscess. Will give Toradol  IM in the emergency department.  Will prescribe amoxicillin  and Lodine .  Dental resource given.  On-call dental information given.  Discharged in stable condition.   Final diagnoses:  Pain, dental    ED Discharge Orders  Ordered    amoxicillin  (AMOXIL ) 500 MG capsule  3 times daily        06/12/24 0651    etodolac  (LODINE ) 400 MG tablet  2 times daily        06/12/24 0651               Hildegard Loge, PA-C 06/12/24 0701    Melvenia Motto, MD 06/12/24 [phone]

## 2024-06-12 NOTE — Discharge Instructions (Addendum)
 Follow-up with a dentist.  On-call dentist as well as dental resources listed above.  I have sent Lodine  into the pharmacy which is a strong anti-inflammatory medicine.  Do not combine this with other anti-inflammatory medications such as ibuprofen  or Aleve.  You can take Tylenol  in addition to this medicine.  Take antibiotic as prescribed.  Return for any concerning symptoms.

## 2024-08-08 ENCOUNTER — Encounter: Admitting: Nurse Practitioner

## 2024-08-23 ENCOUNTER — Telehealth: Payer: Self-pay | Admitting: Nurse Practitioner

## 2024-08-23 NOTE — Telephone Encounter (Signed)
 Called pt, pt will be present at appt.

## 2024-08-28 ENCOUNTER — Encounter: Admitting: Nurse Practitioner

## 2024-09-18 ENCOUNTER — Encounter: Admitting: Nurse Practitioner

## 2024-09-18 ENCOUNTER — Telehealth: Payer: Self-pay | Admitting: Nurse Practitioner

## 2024-09-18 NOTE — Telephone Encounter (Signed)
 Noted, Appointment rescheduled.

## 2024-09-18 NOTE — Telephone Encounter (Signed)
 Copied from CRM [id number]. Topic: Appointments - Appointment Cancel/Reschedule >> Sep 18, 2024  9:26 AM Charlet HERO wrote:  Patient/patient representative is calling to reschedule an appointment. Refer to attachments for appointment information.

## 2024-10-25 ENCOUNTER — Telehealth: Payer: Self-pay | Admitting: Nurse Practitioner

## 2024-10-25 NOTE — Telephone Encounter (Signed)
 Pt unconfirmed appt 12/3 (per vr ) lvm

## 2024-10-27 ENCOUNTER — Ambulatory Visit: Admitting: Nurse Practitioner
